# Patient Record
Sex: Female | Born: 1945
Health system: Southern US, Community
[De-identification: ages and names within clinical notes are randomized; demographics above are authoritative.]

## PROBLEM LIST (undated history)

## (undated) DIAGNOSIS — S270XXA Traumatic pneumothorax, initial encounter: Secondary | ICD-10-CM

## (undated) DIAGNOSIS — F329 Major depressive disorder, single episode, unspecified: Secondary | ICD-10-CM

## (undated) DIAGNOSIS — C439 Malignant melanoma of skin, unspecified: Secondary | ICD-10-CM

## (undated) DIAGNOSIS — S42002A Fracture of unspecified part of left clavicle, initial encounter for closed fracture: Secondary | ICD-10-CM

## (undated) DIAGNOSIS — F32A Depression, unspecified: Secondary | ICD-10-CM

## (undated) DIAGNOSIS — S2242XA Multiple fractures of ribs, left side, initial encounter for closed fracture: Secondary | ICD-10-CM

## (undated) DIAGNOSIS — G4730 Sleep apnea, unspecified: Secondary | ICD-10-CM

## (undated) DIAGNOSIS — J189 Pneumonia, unspecified organism: Secondary | ICD-10-CM

## (undated) DIAGNOSIS — M1990 Unspecified osteoarthritis, unspecified site: Secondary | ICD-10-CM

## (undated) DIAGNOSIS — S2220XA Unspecified fracture of sternum, initial encounter for closed fracture: Secondary | ICD-10-CM

## (undated) DIAGNOSIS — R0602 Shortness of breath: Secondary | ICD-10-CM

## (undated) DIAGNOSIS — M1710 Unilateral primary osteoarthritis, unspecified knee: Secondary | ICD-10-CM

## (undated) DIAGNOSIS — I10 Essential (primary) hypertension: Secondary | ICD-10-CM

## (undated) DIAGNOSIS — H269 Unspecified cataract: Secondary | ICD-10-CM

## (undated) DIAGNOSIS — M179 Osteoarthritis of knee, unspecified: Secondary | ICD-10-CM

## (undated) DIAGNOSIS — S37009A Unspecified injury of unspecified kidney, initial encounter: Secondary | ICD-10-CM

## (undated) HISTORY — PX: TONSILLECTOMY AND ADENOIDECTOMY: SUR1326

## (undated) HISTORY — PX: ADENOIDECTOMY: SUR15

## (undated) HISTORY — PX: CHOLECYSTECTOMY: SHX55

---

## 1972-11-23 DIAGNOSIS — S2220XA Unspecified fracture of sternum, initial encounter for closed fracture: Secondary | ICD-10-CM

## 1972-11-23 DIAGNOSIS — S42002A Fracture of unspecified part of left clavicle, initial encounter for closed fracture: Secondary | ICD-10-CM

## 1972-11-23 DIAGNOSIS — S270XXA Traumatic pneumothorax, initial encounter: Secondary | ICD-10-CM

## 1972-11-23 DIAGNOSIS — S2242XA Multiple fractures of ribs, left side, initial encounter for closed fracture: Secondary | ICD-10-CM

## 1972-11-23 HISTORY — DX: Multiple fractures of ribs, left side, initial encounter for closed fracture: S22.42XA

## 1972-11-23 HISTORY — DX: Fracture of unspecified part of left clavicle, initial encounter for closed fracture: S42.002A

## 1972-11-23 HISTORY — DX: Unspecified fracture of sternum, initial encounter for closed fracture: S22.20XA

## 1972-11-23 HISTORY — DX: Traumatic pneumothorax, initial encounter: S27.0XXA

## 1988-11-23 DIAGNOSIS — C439 Malignant melanoma of skin, unspecified: Secondary | ICD-10-CM

## 1988-11-23 HISTORY — DX: Malignant melanoma of skin, unspecified: C43.9

## 2001-03-29 ENCOUNTER — Ambulatory Visit (HOSPITAL_COMMUNITY): Admission: RE | Admit: 2001-03-29 | Discharge: 2001-03-29 | Payer: Self-pay | Admitting: Gastroenterology

## 2001-03-29 ENCOUNTER — Encounter (INDEPENDENT_AMBULATORY_CARE_PROVIDER_SITE_OTHER): Payer: Self-pay

## 2002-09-24 ENCOUNTER — Ambulatory Visit (HOSPITAL_BASED_OUTPATIENT_CLINIC_OR_DEPARTMENT_OTHER): Admission: RE | Admit: 2002-09-24 | Discharge: 2002-09-24 | Payer: Self-pay | Admitting: Family Medicine

## 2002-11-03 ENCOUNTER — Encounter: Payer: Self-pay | Admitting: Emergency Medicine

## 2002-11-03 ENCOUNTER — Emergency Department (HOSPITAL_COMMUNITY): Admission: EM | Admit: 2002-11-03 | Discharge: 2002-11-03 | Payer: Self-pay | Admitting: Emergency Medicine

## 2004-11-23 HISTORY — PX: SHOULDER ARTHROSCOPY: SHX128

## 2005-07-23 ENCOUNTER — Ambulatory Visit (HOSPITAL_COMMUNITY): Admission: RE | Admit: 2005-07-23 | Discharge: 2005-07-24 | Payer: Self-pay | Admitting: Orthopaedic Surgery

## 2010-07-07 ENCOUNTER — Other Ambulatory Visit: Admission: RE | Admit: 2010-07-07 | Discharge: 2010-07-07 | Payer: Self-pay | Admitting: Family Medicine

## 2011-04-10 NOTE — Procedures (Signed)
[facility]  Patient:    Kristina Perkins, Kristina Perkins                       MRN: [id number] Proc. Date: 03/29/01 Adm. Date:  [id number] Disc. Date: [id number] Attending:  Nelda Marseille CC:         Charles A. Tenny Craw, M.D.   Procedure Report  PROCEDURE:  Colonoscopy with biopsy.  INDICATIONS FOR PROCEDURE:  Family history of colon cancer in a patient due for colonic screening. Occasional gas and urgency status post cholecystectomy.  Consent was signed after risks, benefits, methods, and options were thoroughly discussed in the office.  MEDICINES USED:  Demerol 100, Versed 10.  DESCRIPTION OF PROCEDURE:  Rectal inspection was pertinent for external hemorrhoids, small. Digital exam was negative. The video colonoscope was inserted, easily advanced to the ascending colon. We could see the ileocecal valve in the distance. To advance to the cecum did require rolling her on her back and some abdominal pressure. The prep in the cecum and ascending was poor. Lots of washing and suctioning were done. No obvious mass or lesion was seen but some in the stool was stuck to the wall of the colon. The cecum was identified by the appendiceal orifice and the ileocecal valve. The scope was slowly withdrawn. The rest of the prep was adequate. There was minimal liquid stool elsewhere that required washing and suctioning. On slow withdrawal through the colon, two tiny polyps were seen; one in the mid descending and one in the proximal rectum, both cold biopsied x 1 or 2 and put in the same container. There was a rare early left sided diverticula but no other abnormalities were seen. Once back in the rectum, the scope was retroflexed pertinent for some internal hemorrhoids. The scope was straightened, air was withdrawn and the scope removed. The patient tolerated the procedure well. There was no obvious or immediate complication.  ENDOSCOPIC DIAGNOSIS: 1. Internal/external  hemorrhoids. 2. Questionable tiny rectal and descending polyp cold biopsied. 3. Early left sided diverticula. 4. Otherwise within normal limits to the cecum with a poor prep in the cecum    and the proximal ascending.  PLAN:  Await pathology. Would probably recheck screening in five years. Yearly rectals and guaiacs per Dr. Tenny Craw. Happy to see back p.r.n. particularly if her dysphagia gets worse or she would like to talk about medicines for gas and urgency like antispasmodics, Carafate or Questran which also Dr. Tenny Craw could try. DD:  03/29/01 TD:  03/29/01 Job: [id number]

## 2011-04-10 NOTE — Op Note (Signed)
Kristina Perkins, Kristina Perkins                ACCOUNT NO.:  [id number]   MEDICAL RECORD NO.:  [id number]          PATIENT TYPE:  AMB   LOCATION:  SDS                          FACILITY:  [facility]   PHYSICIAN:  Lubertha Basque. Dalldorf, M.D.DATE OF BIRTH:  [birth year]   DATE OF PROCEDURE:  07/23/2005  DATE OF DISCHARGE:                                 OPERATIVE REPORT   PREOPERATIVE DIAGNOSES:  1.  Left shoulder impingement.  2.  Left shoulder AC degeneration.  3.  Left shoulder biceps degeneration.  4.  Bilateral knee degenerative arthritis.   POSTOPERATIVE DIAGNOSES:  1.  Left shoulder impingement.  2.  Left shoulder AC degeneration.  3.  Left shoulder biceps degeneration.  4.  Bilateral knee degenerative arthritis.   PROCEDURES:  1.  Left shoulder arthroscopic acromioplasty.  2.  Left shoulder arthroscopic AC resection.  3.  Left shoulder arthroscopic biceps tenotomy.  4.  Bilateral knee injections.   ANESTHESIA:  General and block.   ATTENDING SURGEON:  Lubertha Basque. Jerl Santos, M.D.   ASSISTANT:  Lindwood Qua, P.A.   INDICATIONS FOR PROCEDURE:  The patient is a 65 year old woman with long  history of left shoulder pain and bilateral end-stage knee degenerative  arthritis. At her shoulder she has failed conservative measures of oral anti-  inflammatories, therapy, and an injection which afforded her about 50%  relief. She has undergone an MRI scan which shows some  biceps degeneration  along with things consistent with impingement and AC degeneration. She has  pain which limits her ability to remain active and rest and she is offered  an arthroscopy. Informed operative consent was obtained after discussion of  possible complications of reaction to anesthesia and infection. She also has  end-stage degeneration of both her knees and wished had these injected while  asleep.   DESCRIPTION OF PROCEDURE:  The patient was taken to the operating suite  where general anesthetic was applied without  difficulty. She is also given a  block in the preanesthesia area. She was positioned in beach-chair position  and prepped and draped in normal sterile fashion. After administration of  preoperative IV antibiotics, an arthroscopy of the left shoulder was  performed through a total of three portals. Glenohumeral joint showed  minimal degenerative change while the biceps tendon was degenerative in the  shoulder. I debrided this and she was left with about two thirds of her  biceps tendon though this did not even appear healthy, so we elected to  perform a tenotomy. She was counseled before the case how she might end up  with a prominence in her upper arm and was fine with that. I shaved this  back to the glenoid. Rotator cuff appeared benign from below except for  small partial-thickness tear debrided. In the subacromial space, she had a  great deal of bursitis and a thorough bursectomy was done. No significant  tear of the cuff could be seen from this aspect. She had a very prominent  subacromial morphology addressed with an acromioplasty back to a flat  surface. This was done with the bur in the lateral  position followed by  transfer of the bur to the posterior position. She also had bone-on-bone  contact at the [facility] joint and a formal AC decompression was done through the  anterior portal. The shoulder was thoroughly irrigated followed by placement  of some Marcaine. Simple sutures of nylon were used to loosely reapproximate  the portals followed by Adaptic and a dry gauze dressing and tape. We then  injected both knees after sterile prep with alcohol using Depo-Medrol and  Marcaine. Band-Aids were applied here. Estimated blood loss and  intraoperative fluids can be obtained from anesthesia records.   DISPOSITION:  The patient was extubated in the operating room and taken to  recovery in stable addition. Plans were for her to go home the same day and  to follow up in the office in less than  a week. I will contact her by phone  tonight.      Lubertha Basque Jerl Santos, M.D.  Electronically Signed     PGD/MEDQ  D:  07/23/2005  T:  07/23/2005  Job:  [id number]

## 2011-10-08 ENCOUNTER — Other Ambulatory Visit: Payer: Self-pay | Admitting: Orthopaedic Surgery

## 2011-10-19 ENCOUNTER — Encounter (HOSPITAL_COMMUNITY): Payer: Self-pay | Admitting: Pharmacy Technician

## 2011-10-22 ENCOUNTER — Other Ambulatory Visit: Payer: Self-pay | Admitting: Obstetrics & Gynecology

## 2011-10-22 DIAGNOSIS — R1900 Intra-abdominal and pelvic swelling, mass and lump, unspecified site: Secondary | ICD-10-CM

## 2011-10-23 ENCOUNTER — Encounter (HOSPITAL_COMMUNITY)
Admission: RE | Admit: 2011-10-23 | Discharge: 2011-10-23 | Disposition: A | Discharge status: Home / Self Care | Payer: 59 | Source: Ambulatory Visit | Attending: Orthopaedic Surgery | Admitting: Orthopaedic Surgery

## 2011-10-23 ENCOUNTER — Encounter (HOSPITAL_COMMUNITY): Payer: Self-pay

## 2011-10-23 ENCOUNTER — Other Ambulatory Visit: Payer: Self-pay | Admitting: Obstetrics & Gynecology

## 2011-10-23 ENCOUNTER — Other Ambulatory Visit: Payer: Self-pay

## 2011-10-23 ENCOUNTER — Ambulatory Visit (HOSPITAL_COMMUNITY)
Admission: RE | Admit: 2011-10-23 | Discharge: 2011-10-23 | Disposition: A | Discharge status: Home / Self Care | Payer: 59 | Source: Ambulatory Visit | Attending: Obstetrics & Gynecology | Admitting: Obstetrics & Gynecology

## 2011-10-23 DIAGNOSIS — R1900 Intra-abdominal and pelvic swelling, mass and lump, unspecified site: Secondary | ICD-10-CM | POA: Insufficient documentation

## 2011-10-23 DIAGNOSIS — M549 Dorsalgia, unspecified: Secondary | ICD-10-CM | POA: Insufficient documentation

## 2011-10-23 DIAGNOSIS — Z780 Asymptomatic menopausal state: Secondary | ICD-10-CM

## 2011-10-23 DIAGNOSIS — Z1231 Encounter for screening mammogram for malignant neoplasm of breast: Secondary | ICD-10-CM

## 2011-10-23 DIAGNOSIS — N949 Unspecified condition associated with female genital organs and menstrual cycle: Secondary | ICD-10-CM | POA: Insufficient documentation

## 2011-10-23 HISTORY — DX: Malignant melanoma of skin, unspecified: C43.9

## 2011-10-23 HISTORY — DX: Unspecified osteoarthritis, unspecified site: M19.90

## 2011-10-23 HISTORY — DX: Essential (primary) hypertension: I10

## 2011-10-23 HISTORY — DX: Sleep apnea, unspecified: G47.30

## 2011-10-23 HISTORY — DX: Traumatic pneumothorax, initial encounter: S27.0XXA

## 2011-10-23 HISTORY — DX: Major depressive disorder, single episode, unspecified: F32.9

## 2011-10-23 HISTORY — DX: Unspecified injury of unspecified kidney, initial encounter: S37.009A

## 2011-10-23 HISTORY — DX: Unspecified cataract: H26.9

## 2011-10-23 HISTORY — DX: Fracture of unspecified part of left clavicle, initial encounter for closed fracture: S42.002A

## 2011-10-23 HISTORY — DX: Unspecified fracture of sternum, initial encounter for closed fracture: S22.20XA

## 2011-10-23 HISTORY — DX: Pneumonia, unspecified organism: J18.9

## 2011-10-23 HISTORY — DX: Multiple fractures of ribs, left side, initial encounter for closed fracture: S22.42XA

## 2011-10-23 HISTORY — DX: Depression, unspecified: F32.A

## 2011-10-23 LAB — PROTIME-INR: Prothrombin Time: 13.6 seconds (ref 11.6–15.2)

## 2011-10-23 LAB — TYPE AND SCREEN: Antibody Screen: NEGATIVE

## 2011-10-23 LAB — DIFFERENTIAL
Basophils Absolute: 0 10*3/uL (ref 0.0–0.1)
Eosinophils Relative: 3 % (ref 0–5)
Lymphocytes Relative: 42 % (ref 12–46)
Neutro Abs: 2.8 10*3/uL (ref 1.7–7.7)

## 2011-10-23 LAB — CBC
MCV: 87.6 fL (ref 78.0–100.0)
Platelets: 227 10*3/uL (ref 150–400)
RDW: 14.1 % (ref 11.5–15.5)
WBC: 5.7 10*3/uL (ref 4.0–10.5)

## 2011-10-23 LAB — BASIC METABOLIC PANEL
Calcium: 9.8 mg/dL (ref 8.4–10.5)
Creatinine, Ser: 0.65 mg/dL (ref 0.50–1.10)
GFR calc Af Amer: >90 mL/min (ref >90)

## 2011-10-23 LAB — URINE MICROSCOPIC-ADD ON

## 2011-10-23 LAB — URINALYSIS, ROUTINE W REFLEX MICROSCOPIC
Bilirubin Urine: NEGATIVE
Glucose, UA: NEGATIVE mg/dL
Hgb urine dipstick: NEGATIVE
Specific Gravity, Urine: 1.004 — ABNORMAL LOW (ref 1.005–1.030)
Urobilinogen, UA: 0.2 mg/dL (ref 0.0–1.0)

## 2011-10-23 LAB — SURGICAL PCR SCREEN: Staphylococcus aureus: NEGATIVE

## 2011-10-23 LAB — APTT: aPTT: 31 seconds (ref 24–37)

## 2011-10-23 NOTE — Pre-Procedure Instructions (Signed)
20 Kristina Perkins  10/23/2011   Your procedure is scheduled on:  November 03, 2011  Report to [facility] at 0800 AM.  Call this number if you have problems the morning of surgery: [phone]   Remember:   Do not eat food:After Midnight.  May have clear liquids: up to 4 Hours before arrival.  Clear liquids include soda, tea, black coffee, apple or grape juice, broth.  Take these medicines the morning of surgery with A SIP OF WATER: Xanax   Do not wear jewelry, make-up or nail polish.  Do not wear lotions, powders, or perfumes. You may wear deodorant.  Do not shave 48 hours prior to surgery.  Do not bring valuables to the hospital.  Contacts, dentures or bridgework may not be worn into surgery.  Leave suitcase in the car. After surgery it may be brought to your room.  For patients admitted to the hospital, checkout time is 11:00 AM the day of discharge.   Patients discharged the day of surgery will not be allowed to drive home.  Special Instructions: Incentive Spirometry - Practice and bring it with you on the day of surgery. and CHG Shower Use Special Wash: 1/2 bottle night before surgery and 1/2 bottle morning of surgery.   Please read over the following fact sheets that you were given: Pain Booklet, Coughing and Deep Breathing, Blood Transfusion Information and Surgical Site Infection Prevention

## 2011-10-23 NOTE — Progress Notes (Signed)
Requested sleep study from [facility]. Will send chart to anesthesia for review of airway history

## 2011-10-26 NOTE — Anesthesia Preprocedure Evaluation (Addendum)
Anesthesia Evaluation  Patient identified by MRN, date of birth, ID band Patient awake  General Assessment Comment:Glidescope utilized in 2006  Reviewed: Allergy & Precautions, H&P , NPO status , Patient's Chart, lab work & pertinent test results  History of Anesthesia Complications (+) DIFFICULT AIRWAY  Airway Mallampati: I TM Distance: >3 FB Neck ROM: Full    Dental  (+) Teeth Intact and Dental Advisory Given   Pulmonary sleep apnea and Continuous Positive Airway Pressure Ventilation , pneumonia ,    Pulmonary exam normal       Cardiovascular Exercise Tolerance: Good hypertension, Pt. on medications     Neuro/Psych    GI/Hepatic   Endo/Other  Morbid obesity  Renal/GU      Musculoskeletal   Abdominal   Peds  Hematology   Anesthesia Other Findings   Reproductive/Obstetrics                         Anesthesia Physical Anesthesia Plan  ASA: III  Anesthesia Plan: General   Post-op Pain Management:    Induction: Intravenous  Airway Management Planned: LMA  Additional Equipment:   Intra-op Plan:   Post-operative Plan: Extubation in OR  Informed Consent: I have reviewed the patients History and Physical, chart, labs and discussed the procedure including the risks, benefits and alternatives for the proposed anesthesia with the patient or authorized representative who has indicated his/her understanding and acceptance.   Dental advisory given  Plan Discussed with: CRNA, Anesthesiologist and Surgeon  Anesthesia Plan Comments: (History of difficult intubation.  Glidescope utilized in 2006.)       Anesthesia Quick Evaluation
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
## 2011-10-27 ENCOUNTER — Other Ambulatory Visit: Payer: Self-pay | Admitting: Obstetrics & Gynecology

## 2011-10-27 DIAGNOSIS — R1900 Intra-abdominal and pelvic swelling, mass and lump, unspecified site: Secondary | ICD-10-CM

## 2011-10-30 ENCOUNTER — Ambulatory Visit (HOSPITAL_COMMUNITY)
Admission: RE | Admit: 2011-10-30 | Discharge: 2011-10-30 | Disposition: A | Discharge status: Home / Self Care | Payer: 59 | Source: Ambulatory Visit | Attending: Obstetrics & Gynecology | Admitting: Obstetrics & Gynecology

## 2011-10-30 DIAGNOSIS — D252 Subserosal leiomyoma of uterus: Secondary | ICD-10-CM | POA: Insufficient documentation

## 2011-10-30 DIAGNOSIS — N949 Unspecified condition associated with female genital organs and menstrual cycle: Secondary | ICD-10-CM | POA: Insufficient documentation

## 2011-10-30 DIAGNOSIS — Z85820 Personal history of malignant melanoma of skin: Secondary | ICD-10-CM | POA: Insufficient documentation

## 2011-10-30 DIAGNOSIS — R1900 Intra-abdominal and pelvic swelling, mass and lump, unspecified site: Secondary | ICD-10-CM

## 2011-10-30 MED ORDER — GADOBENATE DIMEGLUMINE 529 MG/ML IV SOLN
20.0000 mL | Freq: Once | INTRAVENOUS | Status: AC | PRN
  Administered 2011-10-30: 20 mL via INTRAVENOUS

## 2011-10-31 NOTE — H&P (Signed)
Kristina Perkins is an 65 y.o. female.   Chief Complaint: Left knee pain HPI: pt c/o increasing left knee pain. Now pain with every step and night pain. She has failed nsaid and injection. Xrays show bone on bone djd left knee. We have discussed tkr in detail with the patient  Past Medical History  Diagnosis Date  . Difficult intubation     had trouble with previous surgery while here  . Hypertension   . Degenerative joint disease   . Asthma     hx of  . Sleep apnea     uses CPAP sleep study done at [facility]  . Melanoma     22 years ago  . Cataracts, bilateral   . Pneumonia     no recent history of  . Depression   . Pneumothorax, traumatic     38 years ago  . Traumatic injury of the kidney     38 years ago  . Multiple closed fractures of ribs of left side     38 years ago  . Fracture, sternum closed     38 years ago  . Fracture of left clavicle     38 years ago    Past Surgical History  Procedure Date  . Tonsillectomy   . Adenoidectomy   . Cesarean section   . Shoulder arthroscopy     left  . Cholecystectomy     No family history on file. Social History:  reports that she has never smoked. She does not have any smokeless tobacco history on file. She reports that she drinks alcohol. She reports that she does not use illicit drugs.  Allergies:  Allergies  Allergen Reactions  . Sulfa Antibiotics Other (See Comments)    Blood in urine    No current facility-administered medications on file as of .   No current outpatient prescriptions on file as of .    No results found for this or any previous visit (from the past 48 hour(s)). Mr Pelvis W Wo Contrast  10/30/2011  *RADIOLOGY REPORT*  Clinical Data: Pelvic pain.  Pelvic mass recently discovered on ultrasound.  Otherwise asymptomatic.  Prior history of melanoma.  MRI PELVIS WITHOUT AND WITH CONTRAST  Technique:  Multiplanar multisequence MR imaging of the pelvis was performed both before and after administration of  intravenous contrast.  Contrast: 20mL MULTIHANCE GADOBENATE DIMEGLUMINE 529 MG/ML IV SOLN  Comparison: Ultrasound 10/23/2011  Findings: There is a focal fibroid identified emanating off of the left anterolateral lower uterine segment with a significant subserosal component measuring 5.4 x 4.7 x 4.8 cm.  This demonstrates predominately diffuse low signal on T2-weighted images with some central areas of signal loss which would correlate with calcifications seen on prior recent ultrasound.  Several smaller mural fibroids are identified in the anterior upper uterine segment portion of the myometrium.  Overall uterine size is 9 cm in length, 3.5 cm in depth and 6.5 cm in width including the fibroid.  A normal postmenopausal appearance to the endometrium, cervix and vagina are noted.  Both ovaries have a normal postmenopausal appearance and are best imaged on image #8 of series 4.  The bladder and urethra have a normal appearance. The large fibroid does cause some extrinsic compression on the posterior aspect of the bladder.  A trace of simple free fluid is noted in the pelvis. No pathologic adenopathy is seen. No worrisome bone lesions are seen.  IMPRESSION: The previously identified calcified left adnexal mass corresponds to a left  subserosal degenerated fibroid.  Several smaller mural fibroids are also evident.  Otherwise unremarkable postmenopausal pelvic MRI.  Original Report Authenticated By: Bertha Stakes, M.D.    Review of Systems  Constitutional: Negative.   HENT:       Glasses  Eyes: Negative.   Respiratory: Negative.   Cardiovascular: Negative.   Gastrointestinal: Negative.   Genitourinary: Negative.   Musculoskeletal: Negative.   Skin: Negative.   Neurological: Negative.   Endo/Heme/Allergies: Negative.   Psychiatric/Behavioral: The patient is nervous/anxious.     There were no vitals taken for this visit. Physical Exam  Constitutional: She is oriented to person, place, and time. She  appears well-developed.  HENT:  Head: Normocephalic.  Eyes: Pupils are equal, round, and reactive to light.  Neck: Normal range of motion.  Cardiovascular: Normal rate.   Respiratory: Effort normal.  GI: Soft.  Musculoskeletal:       Left knee -rom- 0-110. Some fluid and crepitation. Pain with rom.  Neurological: She is oriented to person, place, and time. She has normal reflexes.  Skin: Skin is warm.  Psychiatric: She has a normal mood and affect.     Assessment Left knee djd. Plan: Left TKR to improve pt's pain   Venie Montesinos R 10/31/2011, 10:44 AM

## 2011-11-03 ENCOUNTER — Encounter (HOSPITAL_COMMUNITY)
Admission: RE | Disposition: A | Discharge status: Home Health | Payer: Self-pay | Source: Ambulatory Visit | Attending: Orthopaedic Surgery

## 2011-11-03 ENCOUNTER — Encounter (HOSPITAL_COMMUNITY): Payer: Self-pay | Admitting: Surgery

## 2011-11-03 ENCOUNTER — Encounter (HOSPITAL_COMMUNITY): Payer: Self-pay | Admitting: *Deleted

## 2011-11-03 ENCOUNTER — Encounter (HOSPITAL_COMMUNITY): Payer: Self-pay | Admitting: Vascular Surgery

## 2011-11-03 ENCOUNTER — Inpatient Hospital Stay (HOSPITAL_COMMUNITY)
Admission: RE | Admit: 2011-11-03 | Discharge: 2011-11-06 | DRG: 470 | Disposition: A | Discharge status: Home Health | Payer: 59 | Source: Ambulatory Visit | Attending: Orthopaedic Surgery | Admitting: Orthopaedic Surgery

## 2011-11-03 ENCOUNTER — Inpatient Hospital Stay (HOSPITAL_COMMUNITY): Payer: 59 | Admitting: Vascular Surgery

## 2011-11-03 DIAGNOSIS — M1712 Unilateral primary osteoarthritis, left knee: Secondary | ICD-10-CM | POA: Diagnosis present

## 2011-11-03 DIAGNOSIS — M1710 Unilateral primary osteoarthritis, unspecified knee: Principal | ICD-10-CM | POA: Diagnosis present

## 2011-11-03 DIAGNOSIS — G4730 Sleep apnea, unspecified: Secondary | ICD-10-CM | POA: Diagnosis present

## 2011-11-03 DIAGNOSIS — F329 Major depressive disorder, single episode, unspecified: Secondary | ICD-10-CM | POA: Diagnosis present

## 2011-11-03 DIAGNOSIS — F3289 Other specified depressive episodes: Secondary | ICD-10-CM | POA: Diagnosis present

## 2011-11-03 DIAGNOSIS — Z85820 Personal history of malignant melanoma of skin: Secondary | ICD-10-CM

## 2011-11-03 DIAGNOSIS — H269 Unspecified cataract: Secondary | ICD-10-CM | POA: Diagnosis present

## 2011-11-03 DIAGNOSIS — I10 Essential (primary) hypertension: Secondary | ICD-10-CM | POA: Diagnosis present

## 2011-11-03 HISTORY — DX: Osteoarthritis of knee, unspecified: M17.9

## 2011-11-03 HISTORY — DX: Unilateral primary osteoarthritis, unspecified knee: M17.10

## 2011-11-03 HISTORY — PX: TOTAL KNEE ARTHROPLASTY: SHX125

## 2011-11-03 HISTORY — DX: Shortness of breath: R06.02

## 2011-11-03 HISTORY — DX: Unspecified osteoarthritis, unspecified site: M19.90

## 2011-11-03 SURGERY — ARTHROPLASTY, KNEE, TOTAL
Anesthesia: Regional | Site: Knee | Laterality: Left | Wound class: Clean

## 2011-11-03 MED ORDER — CEFUROXIME SODIUM 1.5 G IJ SOLR
INTRAMUSCULAR | Status: DC | PRN
  Administered 2011-11-03: 1.5 g

## 2011-11-03 MED ORDER — ONDANSETRON HCL 4 MG/2ML IJ SOLN
INTRAMUSCULAR | Status: DC | PRN
  Administered 2011-11-03: 4 mg via INTRAVENOUS

## 2011-11-03 MED ORDER — PATIENT'S GUIDE TO USING COUMADIN BOOK
Freq: Once | Status: DC
  Filled 2011-11-03: qty 1

## 2011-11-03 MED ORDER — HYDROCHLOROTHIAZIDE 12.5 MG PO CAPS
25.0000 mg | ORAL_CAPSULE | Freq: Every day | ORAL | Status: DC
  Administered 2011-11-03 – 2011-11-06 (×3): 25 mg via ORAL
  Filled 2011-11-03 (×4): qty 2

## 2011-11-03 MED ORDER — MIDAZOLAM HCL 2 MG/2ML IJ SOLN
INTRAMUSCULAR | Status: AC
  Filled 2011-11-03: qty 2

## 2011-11-03 MED ORDER — HYDROMORPHONE HCL PF 1 MG/ML IJ SOLN
0.5000 mg | INTRAMUSCULAR | Status: DC | PRN
  Administered 2011-11-03 – 2011-11-04 (×2): 1 mg via INTRAVENOUS
  Filled 2011-11-03 (×2): qty 1

## 2011-11-03 MED ORDER — ALPRAZOLAM 0.25 MG PO TABS: 0.2500 mg | ORAL_TABLET | Freq: Three times a day (TID) | ORAL | Status: DC | PRN

## 2011-11-03 MED ORDER — SODIUM CHLORIDE 0.9 % IR SOLN
Status: DC | PRN
  Administered 2011-11-03: 3000 mL

## 2011-11-03 MED ORDER — ENOXAPARIN SODIUM 30 MG/0.3ML ~~LOC~~ SOLN
30.0000 mg | Freq: Two times a day (BID) | SUBCUTANEOUS | Status: DC
  Administered 2011-11-04 – 2011-11-06 (×5): 30 mg via SUBCUTANEOUS
  Filled 2011-11-03 (×6): qty 0.3

## 2011-11-03 MED ORDER — OXYCODONE-ACETAMINOPHEN 5-325 MG PO TABS
1.0000 | ORAL_TABLET | ORAL | Status: DC | PRN
  Administered 2011-11-03: 1 via ORAL
  Administered 2011-11-04 (×2): 2 via ORAL
  Administered 2011-11-04 (×2): 1 via ORAL
  Administered 2011-11-05 (×4): 2 via ORAL
  Administered 2011-11-06: 1 via ORAL
  Administered 2011-11-06: 2 via ORAL
  Filled 2011-11-03 (×3): qty 2
  Filled 2011-11-03: qty 1
  Filled 2011-11-03 (×4): qty 2
  Filled 2011-11-03: qty 1
  Filled 2011-11-03 (×2): qty 2

## 2011-11-03 MED ORDER — LACTATED RINGERS IV SOLN
INTRAVENOUS | Status: DC
  Administered 2011-11-03: 09:00:00 via INTRAVENOUS

## 2011-11-03 MED ORDER — ACETAMINOPHEN 650 MG RE SUPP: 650.0000 mg | Freq: Four times a day (QID) | RECTAL | Status: DC | PRN

## 2011-11-03 MED ORDER — ONDANSETRON HCL 4 MG PO TABS: 4.0000 mg | ORAL_TABLET | Freq: Four times a day (QID) | ORAL | Status: DC | PRN

## 2011-11-03 MED ORDER — FENTANYL CITRATE 0.05 MG/ML IJ SOLN
INTRAMUSCULAR | Status: DC | PRN
  Administered 2011-11-03 (×2): 100 ug via INTRAVENOUS

## 2011-11-03 MED ORDER — METOCLOPRAMIDE HCL 5 MG/ML IJ SOLN
5.0000 mg | Freq: Three times a day (TID) | INTRAMUSCULAR | Status: DC | PRN
  Filled 2011-11-03: qty 2

## 2011-11-03 MED ORDER — SUCCINYLCHOLINE CHLORIDE 20 MG/ML IJ SOLN
INTRAMUSCULAR | Status: DC | PRN
  Administered 2011-11-03 (×2): 100 mg via INTRAVENOUS

## 2011-11-03 MED ORDER — SENNOSIDES-DOCUSATE SODIUM 8.6-50 MG PO TABS: 1.0000 | ORAL_TABLET | Freq: Every evening | ORAL | Status: DC | PRN

## 2011-11-03 MED ORDER — CEFAZOLIN SODIUM 1-5 GM-% IV SOLN
INTRAVENOUS | Status: AC
  Filled 2011-11-03: qty 100

## 2011-11-03 MED ORDER — METOCLOPRAMIDE HCL 5 MG PO TABS
5.0000 mg | ORAL_TABLET | Freq: Three times a day (TID) | ORAL | Status: DC | PRN
  Filled 2011-11-03: qty 2

## 2011-11-03 MED ORDER — PHENOL 1.4 % MT LIQD
1.0000 | OROMUCOSAL | Status: DC | PRN
  Filled 2011-11-03: qty 177

## 2011-11-03 MED ORDER — MEPERIDINE HCL 25 MG/ML IJ SOLN: 6.2500 mg | INTRAMUSCULAR | Status: DC | PRN

## 2011-11-03 MED ORDER — LACTATED RINGERS IV SOLN
INTRAVENOUS | Status: DC | PRN
  Administered 2011-11-03 (×3): via INTRAVENOUS

## 2011-11-03 MED ORDER — METHOCARBAMOL 500 MG PO TABS
500.0000 mg | ORAL_TABLET | Freq: Four times a day (QID) | ORAL | Status: DC | PRN
  Administered 2011-11-04 – 2011-11-06 (×5): 500 mg via ORAL
  Filled 2011-11-03 (×7): qty 1

## 2011-11-03 MED ORDER — DOCUSATE SODIUM 100 MG PO CAPS
100.0000 mg | ORAL_CAPSULE | Freq: Two times a day (BID) | ORAL | Status: DC
  Administered 2011-11-03 – 2011-11-06 (×6): 100 mg via ORAL
  Filled 2011-11-03 (×7): qty 1

## 2011-11-03 MED ORDER — MIDAZOLAM HCL 2 MG/2ML IJ SOLN
1.0000 mg | INTRAMUSCULAR | Status: DC | PRN
  Administered 2011-11-03: 2 mg via INTRAVENOUS

## 2011-11-03 MED ORDER — HYDROMORPHONE HCL PF 1 MG/ML IJ SOLN
0.2500 mg | INTRAMUSCULAR | Status: DC | PRN
  Administered 2011-11-03: 0.5 mg via INTRAVENOUS

## 2011-11-03 MED ORDER — WARFARIN VIDEO: Freq: Once | Status: DC

## 2011-11-03 MED ORDER — PROPOFOL 10 MG/ML IV EMUL
INTRAVENOUS | Status: DC | PRN
  Administered 2011-11-03: 100 mg via INTRAVENOUS
  Administered 2011-11-03: 200 mg via INTRAVENOUS
  Administered 2011-11-03 (×3): 100 mg via INTRAVENOUS

## 2011-11-03 MED ORDER — FENTANYL CITRATE 0.05 MG/ML IJ SOLN
50.0000 ug | INTRAMUSCULAR | Status: DC | PRN
  Administered 2011-11-03: 100 ug via INTRAVENOUS

## 2011-11-03 MED ORDER — MORPHINE SULFATE 2 MG/ML IJ SOLN: 0.0500 mg/kg | INTRAMUSCULAR | Status: DC | PRN

## 2011-11-03 MED ORDER — MENTHOL 3 MG MT LOZG: 1.0000 | LOZENGE | OROMUCOSAL | Status: DC | PRN

## 2011-11-03 MED ORDER — ACETAMINOPHEN 325 MG PO TABS
650.0000 mg | ORAL_TABLET | Freq: Four times a day (QID) | ORAL | Status: DC | PRN
  Administered 2011-11-04: 650 mg via ORAL
  Filled 2011-11-03: qty 2

## 2011-11-03 MED ORDER — CHLORHEXIDINE GLUCONATE 4 % EX LIQD: 60.0000 mL | Freq: Once | CUTANEOUS | Status: DC

## 2011-11-03 MED ORDER — DIPHENHYDRAMINE HCL 12.5 MG/5ML PO ELIX
12.5000 mg | ORAL_SOLUTION | ORAL | Status: DC | PRN
  Filled 2011-11-03: qty 10

## 2011-11-03 MED ORDER — ONDANSETRON HCL 4 MG/2ML IJ SOLN: 4.0000 mg | Freq: Four times a day (QID) | INTRAMUSCULAR | Status: DC | PRN

## 2011-11-03 MED ORDER — CEFAZOLIN SODIUM 1-5 GM-% IV SOLN
INTRAVENOUS | Status: DC | PRN
  Administered 2011-11-03: 2 g via INTRAVENOUS

## 2011-11-03 MED ORDER — METHOCARBAMOL 100 MG/ML IJ SOLN
500.0000 mg | Freq: Four times a day (QID) | INTRAVENOUS | Status: DC | PRN
  Administered 2011-11-03: 500 mg via INTRAVENOUS
  Filled 2011-11-03: qty 5

## 2011-11-03 MED ORDER — WARFARIN SODIUM 5 MG PO TABS
5.0000 mg | ORAL_TABLET | Freq: Once | ORAL | Status: AC
  Administered 2011-11-03: 5 mg via ORAL
  Filled 2011-11-03: qty 1

## 2011-11-03 MED ORDER — CEFAZOLIN SODIUM-DEXTROSE 2-3 GM-% IV SOLR
2.0000 g | Freq: Three times a day (TID) | INTRAVENOUS | Status: DC
  Administered 2011-11-03 – 2011-11-04 (×2): 2 g via INTRAVENOUS
  Filled 2011-11-03 (×3): qty 50

## 2011-11-03 MED ORDER — LACTATED RINGERS IV SOLN
INTRAVENOUS | Status: DC
  Administered 2011-11-04: 04:00:00 via INTRAVENOUS

## 2011-11-03 MED ORDER — BUPIVACAINE-EPINEPHRINE PF 0.5-1:200000 % IJ SOLN
INTRAMUSCULAR | Status: DC | PRN
  Administered 2011-11-03: 30 mL

## 2011-11-03 MED ORDER — FENTANYL CITRATE 0.05 MG/ML IJ SOLN
INTRAMUSCULAR | Status: AC
  Filled 2011-11-03: qty 2

## 2011-11-03 MED ORDER — ONDANSETRON HCL 4 MG/2ML IJ SOLN: 4.0000 mg | Freq: Once | INTRAMUSCULAR | Status: DC | PRN

## 2011-11-03 SURGICAL SUPPLY — 64 items
AUTOTRANSFUSION W/QD PVC DRAIN (AUTOTRANSFUSION) ×2 IMPLANT
BANDAGE ELASTIC 4 VELCRO ST LF (GAUZE/BANDAGES/DRESSINGS) ×2 IMPLANT
BANDAGE ELASTIC 6 VELCRO ST LF (GAUZE/BANDAGES/DRESSINGS) ×1 IMPLANT
BANDAGE ESMARK 6X9 LF (GAUZE/BANDAGES/DRESSINGS) ×1 IMPLANT
BANDAGE GAUZE ELAST BULKY 4 IN (GAUZE/BANDAGES/DRESSINGS) ×3 IMPLANT
BLADE SAGITTAL 25.0X1.19X90 (BLADE) ×2 IMPLANT
BLADE SURG ROTATE 9660 (MISCELLANEOUS) IMPLANT
BNDG CMPR 9X6 STRL LF SNTH (GAUZE/BANDAGES/DRESSINGS) ×1
BNDG CMPR MED 10X6 ELC LF (GAUZE/BANDAGES/DRESSINGS) ×1
BNDG ELASTIC 6X10 VLCR STRL LF (GAUZE/BANDAGES/DRESSINGS) ×2 IMPLANT
BNDG ESMARK 6X9 LF (GAUZE/BANDAGES/DRESSINGS) ×2
BOWL SMART MIX CTS (DISPOSABLE) ×2 IMPLANT
CEMENT HV SMART SET (Cement) ×3 IMPLANT
CLOTH BEACON ORANGE TIMEOUT ST (SAFETY) ×2 IMPLANT
COVER SURGICAL LIGHT HANDLE (MISCELLANEOUS) ×2 IMPLANT
CUFF TOURNIQUET SINGLE 34IN LL (TOURNIQUET CUFF) ×2 IMPLANT
CUFF TOURNIQUET SINGLE 44IN (TOURNIQUET CUFF) IMPLANT
DRAPE EXTREMITY T 121X128X90 (DRAPE) ×2 IMPLANT
DRAPE PROXIMA HALF (DRAPES) ×2 IMPLANT
DRAPE U-SHAPE 47X51 STRL (DRAPES) ×2 IMPLANT
DRSG ADAPTIC 3X8 NADH LF (GAUZE/BANDAGES/DRESSINGS) ×2 IMPLANT
DRSG PAD ABDOMINAL 8X10 ST (GAUZE/BANDAGES/DRESSINGS) ×2 IMPLANT
DURAPREP 26ML APPLICATOR (WOUND CARE) ×2 IMPLANT
ELECT REM PT RETURN 9FT ADLT (ELECTROSURGICAL) ×2
ELECTRODE REM PT RTRN 9FT ADLT (ELECTROSURGICAL) ×1 IMPLANT
EVACUATOR 1/8 PVC DRAIN (DRAIN) ×2 IMPLANT
FACESHIELD LNG OPTICON STERILE (SAFETY) ×2 IMPLANT
GAUZE SPONGE 4X4 12PLY STRL LF (GAUZE/BANDAGES/DRESSINGS) ×1 IMPLANT
GLOVE BIO SURGEON STRL SZ8.5 (GLOVE) ×2 IMPLANT
GLOVE BIOGEL PI IND STRL 8 (GLOVE) ×1 IMPLANT
GLOVE BIOGEL PI IND STRL 8.5 (GLOVE) ×1 IMPLANT
GLOVE BIOGEL PI INDICATOR 8 (GLOVE) ×1
GLOVE BIOGEL PI INDICATOR 8.5 (GLOVE) ×1
GLOVE SS BIOGEL STRL SZ 8 (GLOVE) ×1 IMPLANT
GLOVE SUPERSENSE BIOGEL SZ 8 (GLOVE) ×1
GOWN PREVENTION PLUS XLARGE (GOWN DISPOSABLE) ×2 IMPLANT
GOWN STRL NON-REIN LRG LVL3 (GOWN DISPOSABLE) ×2 IMPLANT
GOWN STRL REIN 2XL LVL4 (GOWN DISPOSABLE) ×2 IMPLANT
HANDPIECE INTERPULSE COAX TIP (DISPOSABLE) ×2
HOOD PEEL AWAY FACE SHEILD DIS (HOOD) ×2 IMPLANT
IMMOBILIZER KNEE 20 (SOFTGOODS)
IMMOBILIZER KNEE 20 THIGH 36 (SOFTGOODS) IMPLANT
IMMOBILIZER KNEE 22 UNIV (SOFTGOODS) ×2 IMPLANT
IMMOBILIZER KNEE 24 THIGH 36 (MISCELLANEOUS) IMPLANT
IMMOBILIZER KNEE 24 UNIV (MISCELLANEOUS)
KIT BASIN OR (CUSTOM PROCEDURE TRAY) ×2 IMPLANT
KIT ROOM TURNOVER OR (KITS) ×2 IMPLANT
MANIFOLD NEPTUNE II (INSTRUMENTS) ×2 IMPLANT
NS IRRIG 1000ML POUR BTL (IV SOLUTION) ×2 IMPLANT
PACK TOTAL JOINT (CUSTOM PROCEDURE TRAY) ×2 IMPLANT
PAD ARMBOARD 7.5X6 YLW CONV (MISCELLANEOUS) ×4 IMPLANT
SET HNDPC FAN SPRY TIP SCT (DISPOSABLE) ×1 IMPLANT
SPONGE GAUZE 4X4 12PLY (GAUZE/BANDAGES/DRESSINGS) ×2 IMPLANT
STAPLER VISISTAT 35W (STAPLE) ×2 IMPLANT
SUCTION FRAZIER TIP 10 FR DISP (SUCTIONS) ×2 IMPLANT
SUT VIC AB 0 CT1 27 (SUTURE) ×4
SUT VIC AB 0 CT1 27XBRD ANBCTR (SUTURE) ×2 IMPLANT
SUT VIC AB 1 CTB1 27 (SUTURE) ×4 IMPLANT
SUT VIC AB 2-0 CT1 27 (SUTURE) ×4
SUT VIC AB 2-0 CT1 TAPERPNT 27 (SUTURE) ×2 IMPLANT
TOWEL OR 17X24 6PK STRL BLUE (TOWEL DISPOSABLE) ×2 IMPLANT
TOWEL OR 17X26 10 PK STRL BLUE (TOWEL DISPOSABLE) ×2 IMPLANT
TRAY FOLEY CATH 14FR (SET/KITS/TRAYS/PACK) ×2 IMPLANT
WATER STERILE IRR 1000ML POUR (IV SOLUTION) ×6 IMPLANT

## 2011-11-03 NOTE — Op Note (Signed)
PREOP DIAGNOSIS: DJD LEFT KNEE POSTOP DIAGNOSIS: DJD LEFT KNEE PROCEDURE: LEFT TKR ANESTHESIA: General and block ATTENDING SURGEON: Donat Humble G ASSISTANT: Lindwood Qua PA  INDICATIONS FOR PROCEDURE: Kristina Perkins is a 65 y.o. female who has struggled for a long time with pain due to degenerative arthritis of the left knee.  The patient has failed many conservative non-operative measures and at this point has pain which limits the ability to sleep and walk.  The patient is offered total knee replacement.  Informed operative consent was obtained after discussion of possible risks of anesthesia, infection, neurovascular injury, DVT, and death.  The importance of the post-operative rehabilitation protocol to optimize result was stressed extensively with the patient.  SUMMARY OF FINDINGS AND PROCEDURE:  Kristina Perkins was taken to the operative suite where under the above anesthesia a left knee replacement was performed.  There were advanced degenerative changes and the bone quality was fair.  We used the DePuy system and placed size standard plus femur, 4 MBT tibia to address her stature, 38mm all polyethylene patella, and a size 10 spacer.  I did include Zinacef antibiotic in the cement.  The patient was admitted for appropriate post-op care to include perioperative antibiotics and mechanical and pharmacologic measures for DVT prophylaxis.  DESCRIPTION OF PROCEDURE:  Kristina Perkins was taken to the operative suite where the above anesthesia was applied.  The patient was positioned supine and prepped and draped in normal sterile fashion.  An appropriate time out was performed.  After the administration of Kefzol pre-op antibiotic a standard longitudinal incision was made on the anterior knee.  Dissection was carried down to the extensor mechanism.  All appropriate anti-infective measures were used including the pre-operative antibiotic, betadine impregnated drape, and closed hooded exhaust systems  for each member of the surgical team.  A medial parapatellar incision was made in the extensor mechanism and the knee cap flipped and the knee flexed.  Some residual meniscal tissues were removed along with any remaining ACL/PCL tissue.  A guide was placed on the tibia and a flat cut was made on it's superior surface.  An intramedullary guide was placed in the femur and was utilized to make anterior and posterior cuts creating an appropriate flexion gap.  A second intramedullary guide was placed in the femur to make a distal cut properly balancing the knee with an extension gap equal to the flexion gap.  The three bones sized to the above mentioned sizes and the appropriate guides were placed and utilized.  A trial reduction was done and the knee easily came to full extension and the patella tracked well on flexion.  The trial components were removed and all bones were cleaned with pulsatile lavage and then dried thoroughly.  Cement was mixed including antibiotic and was pressurized onto the bones followed by placement of the aforementioned components.  Excess cement was trimmed and pressure was held on the components until the cement had hardened.  The tourniquet was deflated and a small amount of bleeding was controlled with cautery and pressure.  The knee was irrigated and a drain placed exiting superolaterally.  The extensor mechanism was re-approximated with #1 suture.  The knee was flexed and the repair was solid.  The subcutaneous tissues were re-approximated with #0 and #2-0 vicryl and the skin closed with staples.  A sterile dressing was applied.  Intraoperative fluids, EBL, and tourniquet time can be obtained from anesthesia records.  DISPOSITION:  The patient was taken to recovery  room in stable condition and admitted for appropriate post-op care to include peri-operative antibiotic and DVT prophylaxis with mechanical and pharmacologic measures.  Shaleena Crusoe G 11/03/2011, 12:48 PM

## 2011-11-03 NOTE — Anesthesia Procedure Notes (Addendum)
Anesthesia Regional Block:  Femoral nerve block  Pre-Anesthetic Checklist: ,, timeout performed, Correct Patient, Correct Site, Correct Laterality, Correct Procedure, Correct Position, site marked, Risks and benefits discussed,  Surgical consent,  Pre-op evaluation,  At surgeon's request and post-op pain management  Laterality: Left and Lower  Prep: chloraprep       Needles:  Injection technique: Single-shot  Needle Type: Echogenic Needle     Needle Length: 9cm  Needle Gauge: 22 and 22 G    Additional Needles:  Procedures: ultrasound guided Femoral nerve block Narrative:  Start time: 11/03/2011 9:45 AM End time: 11/03/2011 10:00 AM Injection made incrementally with aspirations every 5 mL.  Performed by: Personally  Anesthesiologist: Sheldon Silvan, MD  Additional Notes: Marcaine 0.5% with EPI 1:200000  30 ml  Femoral nerve block Procedure Name: Intubation Date/Time: 11/03/2011 11:20 AM Performed by: Rossie Muskrat Pre-anesthesia Checklist: Timeout performed, Patient identified, Emergency Drugs available, Suction available and Patient being monitored Patient Re-evaluated:Patient Re-evaluated prior to inductionOxygen Delivery Method: Circle System Utilized Preoxygenation: Pre-oxygenation with 100% oxygen Intubation Type: IV induction Ventilation: Mask ventilation without difficulty and Oral airway inserted - appropriate to patient size Grade View: Grade IV Tube type: Oral Number of attempts: 5 or more Airway Equipment and Method: video-laryngoscopy and bougie stylet Placement Confirmation: breath sounds checked- equal and bilateral and positive ETCO2 Secured at: 23 cm Tube secured with: Tape Dental Injury: Bloody posterior oropharynx  Difficulty Due To: Difficulty was anticipated, Difficult Airway- due to large tongue, Difficult Airway- due to reduced neck mobility, Difficult Airway- due to limited oral opening and Difficult Airway- due to anterior larynx Future  Recommendations: Recommend- awake intubation Comments: Multiple DL attempts for view of cords with Glidescope required. Immediate use of glidescope was initiated due to known difficult airway. Syliva Overman, CRNA & Kingsley Callander, CRNA, made attempts to obtain view of cords unsuccessfully with Glidescope. Pt was easily hand ventilated with oral airway in place between each glidescope attempt. Sats remained 97-100% through each glidescope attempt. Unable to visualize epiglottis. Head repositioned; 2nd glidescope attempted by T. Wilhelmenia Blase. Epiglottis in view only (no view beyond epiglottis). Blind intubation attempted unsuccessfully, No ETCO2 or chest rise. ETT removed. Pt hand ventilated with oral airway easily. 5th DL attempt (5th attempt total; 3rd DL by Kingsley Callander, CRNA) with glidescope by Kingsley Callander, CRNA. Identical view of epiglottis only. Bougie stylette used to place ETT thru cords. Vocal cords never visualized, however + ETCO2, =BBS present, B chest rise. ETT secured.    Performed by: Rossie Muskrat

## 2011-11-03 NOTE — Preoperative (Signed)
Beta Blockers   Reason not to administer Beta Blockers:pt does not take b blockers

## 2011-11-03 NOTE — Transfer of Care (Signed)
Immediate Anesthesia Transfer of Care Note  Patient: Kristina Perkins  Procedure(s) Performed:  TOTAL KNEE ARTHROPLASTY - Primary left total knee with revision components   Patient Location: PACU  Anesthesia Type: General  Level of Consciousness: awake, alert  and oriented  Airway & Oxygen Therapy: Patient Spontanous Breathing and Patient connected to face mask oxygen  Post-op Assessment: Report given to PACU RN, Post -op Vital signs reviewed and stable and Patient moving all extremities X 4  Post vital signs: Reviewed and stable  Complications: No apparent anesthesia complications

## 2011-11-03 NOTE — Anesthesia Postprocedure Evaluation (Signed)
  Anesthesia Post-op Note  Patient: Kristina Perkins  Procedure(s) Performed:  TOTAL KNEE ARTHROPLASTY - Primary left total knee with revision components   Patient Location: PACU  Anesthesia Type: GA combined with regional for post-op pain  Level of Consciousness: awake, alert  and oriented  Airway and Oxygen Therapy: Patient Spontanous Breathing  Post-op Pain: mild  Post-op Assessment: Post-op Vital signs reviewed, Patient's Cardiovascular Status Stable, Respiratory Function Stable, Patent Airway, No signs of Nausea or vomiting and Pain level controlled  Post-op Vital Signs: Reviewed and stable  Complications: No apparent anesthesia complications

## 2011-11-03 NOTE — Progress Notes (Signed)
ANTICOAGULATION CONSULT NOTE - Initial Consult  Pharmacy Consult for Coumadin Indication: VTE prophylaxis  Allergies  Allergen Reactions  . Sulfa Antibiotics Other (See Comments)    Blood in urine    Patient Measurements: Height: 5\' 6"  (167.6 cm) Weight: 263 lb (119.296 kg) IBW/kg (Calculated) : 59.3    Vital Signs: Temp: 97.7 F (36.5 C) (12/11 1705) Temp src: Axillary (12/11 1705) BP: 161/82 mmHg (12/11 1705) Pulse Rate: 90  (12/11 1705)  Labs: No results found for this basename: HGB:2,HCT:3,PLT:3,APTT:3,LABPROT:3,INR:3,HEPARINUNFRC:3,CREATININE:3,CKTOTAL:3,CKMB:3,TROPONINI:3 in the last 72 hours Estimated Creatinine Clearance: 92.2 ml/min (by C-G formula based on Cr of 0.65).  Medical History: Past Medical History  Diagnosis Date  . Difficult intubation     had trouble with previous surgery while here  . Hypertension   . Degenerative joint disease   . Asthma     hx of  . Sleep apnea     uses CPAP sleep study done at [facility]  . Melanoma     22 years ago  . Cataracts, bilateral   . Pneumonia     no recent history of  . Depression   . Pneumothorax, traumatic     38 years ago  . Traumatic injury of the kidney     38 years ago  . Multiple closed fractures of ribs of left side     38 years ago  . Fracture, sternum closed     38 years ago  . Fracture of left clavicle     38 years ago    Medications:  Prescriptions prior to admission  Medication Sig Dispense Refill  . acetaminophen (TYLENOL) 500 MG tablet Take 1,000 mg by mouth every 6 (six) hours as needed. pain       . ALPRAZolam (XANAX) 0.25 MG tablet Take 0.25 mg by mouth 3 (three) times daily as needed. For anxiety       . diphenhydramine-acetaminophen (TYLENOL PM) 25-500 MG TABS Take 1 tablet by mouth at bedtime as needed. For sleep        . hydrochlorothiazide (MICROZIDE) 12.5 MG capsule Take 25 mg by mouth daily.       . meloxicam (MOBIC) 15 MG tablet Take 15 mg by mouth daily.           Assessment: 65 year old woman s/p TKR to received Warfarin post op for VTE prophylaxis Goal of Therapy:  INR 2-3   Plan: Warfarin 5mg  po x 1 today.  Coumadin book and video.  Daily protimes.  Mickeal Skinner 11/03/2011,5:32 PM

## 2011-11-03 NOTE — Interval H&P Note (Signed)
History and Physical Interval Note:  11/03/2011 7:31 AM  Kristina Perkins  has presented today for surgery, with the diagnosis of LEFT KNEE DEGENERATIVE JOINT DISEASE  The various methods of treatment have been discussed with the patient and family. After consideration of risks, benefits and other options for treatment, the patient has consented to  Procedure(s): TOTAL KNEE ARTHROPLASTY as a surgical intervention .  The patients' history has been reviewed, patient examined, no change in status, stable for surgery.  I have reviewed the patients' chart and labs.  Questions were answered to the patient's satisfaction.     Bird Swetz G

## 2011-11-04 ENCOUNTER — Encounter (HOSPITAL_COMMUNITY): Payer: Self-pay | Admitting: General Practice

## 2011-11-04 HISTORY — PX: TOTAL KNEE ARTHROPLASTY: SHX125

## 2011-11-04 LAB — PROTIME-INR
INR: 1.18 (ref 0.00–1.49)
Prothrombin Time: 15.3 seconds — ABNORMAL HIGH (ref 11.6–15.2)

## 2011-11-04 LAB — BASIC METABOLIC PANEL
CO2: 31 mEq/L (ref 19–32)
Chloride: 101 mEq/L (ref 96–112)
GFR calc Af Amer: >90 mL/min (ref >90)
Potassium: 3.5 mEq/L (ref 3.5–5.1)

## 2011-11-04 LAB — CBC
Platelets: 189 10*3/uL (ref 150–400)
RBC: 3.78 MIL/uL — ABNORMAL LOW (ref 3.87–5.11)
RDW: 14.5 % (ref 11.5–15.5)
WBC: 6.8 10*3/uL (ref 4.0–10.5)

## 2011-11-04 MED ORDER — PNEUMOCOCCAL VAC POLYVALENT 25 MCG/0.5ML IJ INJ
0.5000 mL | INJECTION | INTRAMUSCULAR | Status: AC
  Administered 2011-11-05: 0.5 mL via INTRAMUSCULAR
  Filled 2011-11-04: qty 0.5

## 2011-11-04 MED ORDER — CEFAZOLIN SODIUM-DEXTROSE 2-3 GM-% IV SOLR
2.0000 g | Freq: Three times a day (TID) | INTRAVENOUS | Status: AC
  Administered 2011-11-04: 2 g via INTRAVENOUS
  Filled 2011-11-04: qty 50

## 2011-11-04 MED ORDER — WARFARIN SODIUM 5 MG PO TABS
5.0000 mg | ORAL_TABLET | Freq: Once | ORAL | Status: AC
Start: 2011-11-04 — End: 2011-11-04
  Administered 2011-11-04: 5 mg via ORAL
  Filled 2011-11-04: qty 1

## 2011-11-04 MED ORDER — INFLUENZA VIRUS VACC SPLIT PF IM SUSP
0.5000 mL | INTRAMUSCULAR | Status: AC
  Administered 2011-11-05: 0.5 mL via INTRAMUSCULAR
  Filled 2011-11-04: qty 0.5

## 2011-11-04 NOTE — Progress Notes (Signed)
Subjective: 1 Day Post-Op Procedure(s) (LRB): TOTAL KNEE ARTHROPLASTY (Left)  Activity level: oob with pt Diet tolerance:eating Voiding with or without catheter:voiding Patient reports pain as 3 on 0-10 scale.    Objective: Vital signs in last 24 hours: Temp:  [97.1 F (36.2 C)-98.8 F (37.1 C)] 98.8 F (37.1 C) (12/12 0525) Pulse Rate:  [72-101] 101  (12/12 0525) Resp:  [12-22] 18  (12/12 0525) BP: (125-170)/(63-95) 141/63 mmHg (12/12 0525) SpO2:  [95 %-100 %] 97 % (12/12 0525) FiO2 (%):  [2 %] 2 % (12/11 1705) Weight:  [119.296 kg (263 lb)] 263 lb (119.296 kg) (12/11 1705)  Intake/Output from previous day: 12/11 0701 - 12/12 0700 In: 2950 [P.O.:150; I.V.:2750; IV Piggyback:50] Out: 4225 [Urine:3950; Drains:225; Blood:50] Intake/Output this shift:     Basename 11/04/11 0545  HGB 10.8*    Basename 11/04/11 0545  WBC 6.8  RBC 3.78*  HCT 33.9*  PLT 189   No results found for this basename: NA:2,K:2,CL:2,CO2:2,BUN:2,CREATININE:2,GLUCOSE:2,CALCIUM:2 in the last 72 hours  Basename 11/04/11 0545  LABPT --  INR 1.18    Neurologically intact ABD soft Neurovascular intact Sensation intact distally Intact pulses distally Dorsiflexion/Plantar flexion intact No cellulitis present Compartment soft  Assessment/Plan: 1 Day Post-Op Procedure(s) (LRB): TOTAL KNEE ARTHROPLASTY (Left) Advance diet Up with therapy D/C IV fluids Will order cpap  Kristina Perkins R 11/04/2011, 8:08 AM

## 2011-11-04 NOTE — Progress Notes (Signed)
Occupational Therapy Evaluation Patient Details Name: Kristina Perkins MRN: [id number] DOB: [birth year] Today's Date: 11/04/2011  Problem List:  Patient Active Problem List  Diagnoses  . Left knee DJD    Past Medical History:  Past Medical History  Diagnosis Date  . Difficult intubation     had trouble with previous surgery while here  . Hypertension   . Degenerative joint disease   . Asthma     hx of  . Sleep apnea     uses CPAP sleep study done at [facility]  . Melanoma     22 years ago  . Cataracts, bilateral   . Pneumonia     no recent history of  . Depression   . Pneumothorax, traumatic     38 years ago  . Traumatic injury of the kidney     38 years ago  . Multiple closed fractures of ribs of left side     38 years ago  . Fracture, sternum closed     38 years ago  . Fracture of left clavicle     38 years ago   Past Surgical History:  Past Surgical History  Procedure Date  . Tonsillectomy   . Adenoidectomy   . Cesarean section   . Shoulder arthroscopy     left  . Cholecystectomy     OT Assessment/Plan/Recommendation OT Assessment Clinical Impression Statement: Pt s/p L TKA and demonstrates with decreased I with ADLs/functional transfers.  Will benefit from acute OT to increase I with ADLs/functional transfers in prep for d/c home OT Recommendation/Assessment: Patient will need skilled OT in the acute care venue OT Problem List: Decreased activity tolerance;Decreased knowledge of use of DME or AE;Pain OT Therapy Diagnosis : Acute pain OT Plan OT Frequency: Min 2X/week OT Treatment/Interventions: Self-care/ADL training;DME and/or AE instruction;Therapeutic activities;Patient/family education OT Recommendation Follow Up Recommendations: None Equipment Recommended: None recommended by OT Individuals Consulted Consulted and Agree with Results and Recommendations: Patient OT Goals Acute Rehab OT Goals OT Goal Formulation: With patient Time For Goal Achievement:  7 days ADL Goals Pt Will Perform Grooming: with modified independence;Standing at sink ADL Goal: Grooming - Progress: Other (comment) Pt Will Perform Lower Body Bathing: with modified independence;Sit to stand from chair;Sit to stand from bed ADL Goal: Lower Body Bathing - Progress: Other (comment) Pt Will Perform Lower Body Dressing: with modified independence;Sit to stand from chair;Sit to stand from bed ADL Goal: Lower Body Dressing - Progress: Other (comment) Pt Will Transfer to Toilet: with modified independence;with DME;Ambulation;3-in-1 ADL Goal: Toilet Transfer - Progress: Other (comment) Pt Will Perform Tub/Shower Transfer: Shower transfer;with modified independence;Ambulation;with DME ADL Goal: Tub/Shower Transfer - Progress: Other (comment)  OT Evaluation Precautions/Restrictions  Precautions Required Braces or Orthoses: No Restrictions Weight Bearing Restrictions: Yes LLE Weight Bearing: Weight bearing as tolerated Prior Functioning Home Living Lives With: Spouse Receives Help From: Family;Friend(s) Type of Home: House Home Layout: Laundry or work area in basement;Able to live on main level with bedroom/bathroom;Two level Alternate Level Stairs-Rails: Right Alternate Level Stairs-Number of Steps: 12 Home Access: Stairs to enter Entrance Stairs-Rails: None Entrance Stairs-Number of Steps: 2 Bathroom Shower/Tub: Walk-in shower;Door [employer] Toilet: Handicapped height Bathroom Accessibility: Yes How Accessible: Accessible via walker Home Adaptive Equipment: Walker - rolling Prior Function Level of Independence: Independent with basic ADLs;Independent with homemaking with ambulation;Independent with gait;Independent with transfers Able to Take Stairs?: Yes Driving: Yes Vocation: 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 Eating/Feeding: Simulated;Independent Where Assessed - Eating/Feeding: Edge  of bed Grooming: Simulated;Set  up Where Assessed - Grooming: Sitting, bed;Unsupported Upper Body Bathing: Simulated;Set up Where Assessed - Upper Body Bathing: Sitting, bed;Unsupported Lower Body Bathing: Simulated;Minimal assistance Where Assessed - Lower Body Bathing: Sit to stand from bed Upper Body Dressing: Simulated;Set up Where Assessed - Upper Body Dressing: Sitting, bed;Unsupported Lower Body Dressing: Simulated;Moderate assistance Where Assessed - Lower Body Dressing: Sit to stand from bed Toilet Transfer: Performed;Minimal assistance Toilet Transfer Method: Ambulating Toilet Transfer Equipment: Bedside commode Toileting - Clothing Manipulation: Performed;Supervision/safety Toileting - Clothing Manipulation Details (indicate cue type and reason): Pt pulled gown up  Where Assessed - Toileting Clothing Manipulation: Standing Toileting - Hygiene: Performed;Independent Where Assessed - Toileting Hygiene: Sit on 3-in-1 or toilet Tub/Shower Transfer: Not assessed Equipment Used: Rolling walker Vision/Perception    Cognition Cognition Arousal/Alertness: Awake/alert Overall Cognitive Status: Appears within functional limits for tasks assessed Orientation Level: Oriented X4 Sensation/Coordination   Extremity Assessment RUE Assessment RUE Assessment: Within Functional Limits LUE Assessment LUE Assessment: Within Functional Limits Mobility  Bed Mobility Bed Mobility: Yes Supine to Sit: 5: Supervision Sitting - Scoot to Edge of Bed: 5: Supervision Sit to Supine - Right: 5: Supervision Transfers Transfers: Yes Sit to Stand: 4: Min assist;From elevated surface;With upper extremity assist;From bed;From chair/3-in-1 Sit to Stand Details (indicate cue type and reason): Used L knee immobilizer to stabilize LLE as recommended when speaking with PT earlier Stand to Sit: 4: Min assist;With upper extremity assist;To bed Exercises   End of Session OT - End of Session Equipment Utilized During Treatment: Gait  belt;Left knee immobilizer Activity Tolerance: Patient tolerated treatment well Patient left: in bed;with call bell in reach;with family/visitor present General Behavior During Session: [facility] for tasks performed Cognition: [facility] for tasks performed   Cipriano Mile 11/04/2011, 2:53 PM  11/04/2011 Cipriano Mile OTR/L Pager [phone] Office [phone]

## 2011-11-04 NOTE — Progress Notes (Addendum)
Pt is s/p L TKR. Dec ROM LLE related to L TKR. KI per order. CPM per order. Pt is to WBAT with RW. Pt has an ace dressing to L knee that is stained with small amount of bldy drainage at the top. Ice prn L knee. Lungs CTA but noted to be diminished in the bases. Pt repts prod cough with small amount of clear phlegm that is occasionally bld tinged. Pt sats 97% 2lpm. Pt performs IS per order. Pt is to void since foley d/ced this AM. Pt denies nausea or vomiting and repts passing gas. Pt repts tolerating diet fair to good. No s/sx cardiac or resp distress and no c/o such. Vital signs stable. Pt has a hemovac to L knee that is draining only small amount of bldy drainage.

## 2011-11-04 NOTE — Progress Notes (Signed)
Physical Therapy Evaluation Patient Details Name: ATALIA LITZINGER MRN: [id number] DOB: [birth year] Today's Date: 11/04/2011  Problem List:  Patient Active Problem List  Diagnoses  . Left knee DJD    Past Medical History:  Past Medical History  Diagnosis Date  . Difficult intubation     had trouble with previous surgery while here  . Hypertension   . Degenerative joint disease   . Asthma     hx of  . Sleep apnea     uses CPAP sleep study done at [facility]  . Melanoma     22 years ago  . Cataracts, bilateral   . Pneumonia     no recent history of  . Depression   . Pneumothorax, traumatic     38 years ago  . Traumatic injury of the kidney     38 years ago  . Multiple closed fractures of ribs of left side     38 years ago  . Fracture, sternum closed     38 years ago  . Fracture of left clavicle     38 years ago   Past Surgical History:  Past Surgical History  Procedure Date  . Tonsillectomy   . Adenoidectomy   . Cesarean section   . Shoulder arthroscopy     left  . Cholecystectomy     PT Assessment/Plan/Recommendation PT Assessment Clinical Impression Statement: Pt is 65 yo female with L TKA, who presents today with left quad insufficiency and dizziness which limited mobility.  Pt's O2 sats dropped to 85% on RA and O2 was reapplied, BP was stable.  PT will follow acutely to address ROM and strength of the LLE as well as mobility and exercise plan.  Recommend HHPT at d/c, pt has needed equipment. PT Recommendation/Assessment: Patient will need skilled PT in the acute care venue PT Problem List: Decreased strength;Decreased range of motion;Decreased activity tolerance;Decreased balance;Decreased mobility;Decreased coordination;Decreased knowledge of use of DME;Decreased knowledge of precautions;Pain Barriers to Discharge: None PT Therapy Diagnosis : Difficulty walking;Abnormality of gait;Acute pain PT Plan PT Frequency: 7X/week PT Treatment/Interventions: DME  instruction;Gait training;Stair training;Functional mobility training;Therapeutic activities;Therapeutic exercise;Balance training;Patient/family education PT Recommendation Recommendations for Other Services: OT consult Follow Up Recommendations: Home health PT Equipment Recommended: None recommended by PT PT Goals  Acute Rehab PT Goals PT Goal Formulation: With patient Time For Goal Achievement: 7 days Pt will go Supine/Side to Sit: with modified independence;with HOB 0 degrees;Other (comment) (with no rail) PT Goal: Supine/Side to Sit - Progress: Progressing toward goal Pt will go Sit to Supine/Side: with min assist;with HOB 0 degrees (with no rail, min A to left leg) PT Goal: Sit to Supine/Side - Progress: Other (comment) (NT today) Pt will go Sit to Stand: with modified independence PT Goal: Sit to Stand - Progress: Progressing toward goal Pt will go Stand to Sit: with modified independence PT Goal: Stand to Sit - Progress: Progressing toward goal Pt will Transfer Bed to Chair/Chair to Bed: with modified independence;Other (comment) (with RW) PT Transfer Goal: Bed to Chair/Chair to Bed - Progress: Progressing toward goal Pt will Ambulate: >150 feet;with modified independence;with rolling walker PT Goal: Ambulate - Progress: Other (comment) (NT today) Pt will Go Up / Down Stairs: 1-2 stairs;with rolling walker;with min assist PT Goal: Up/Down Stairs - Progress: Other (comment) (NT today) Pt will Perform Home Exercise Program: with supervision, verbal cues required/provided PT Goal: Perform Home Exercise Program - Progress: Progressing toward goal  PT Evaluation Precautions/Restrictions  Precautions Required Braces or Orthoses:  No Restrictions Weight Bearing Restrictions: Yes LLE Weight Bearing: Weight bearing as tolerated Prior Functioning  Home Living Lives With: Spouse Receives Help From: Family;Friend(s) (has a friend that will help when spouse is working) Type of Home:  [employer] Layout: Laundry or work area in basement;Able to live on main level with bedroom/bathroom;Two level Alternate Level Stairs-Rails: Right Alternate Level Stairs-Number of Steps: 12 Home Access: Stairs to enter Entrance Stairs-Rails: None Entrance Stairs-Number of Steps: 2 Bathroom Shower/Tub: Walk-in shower;Door Bathroom Toilet: Handicapped height Bathroom Accessibility: Yes (can take RW in sideways) How Accessible: Accessible via walker (see previous note) Home Adaptive Equipment: Walker - rolling Prior Function Level of Independence: Independent with basic ADLs;Independent with homemaking with ambulation;Independent with gait;Independent with transfers Able to Take Stairs?: Yes Driving: Yes Vocation: 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 Cognition Cognition Arousal/Alertness: Awake/alert Overall Cognitive Status: Appears within functional limits for tasks assessed Orientation Level: Oriented X4 Sensation/Coordination Sensation Light Touch: Appears Intact Stereognosis: Appears Intact Hot/Cold: Appears Intact Proprioception: Impaired by gross assessment Additional Comments: decreased proprioception LLE Coordination Gross Motor Movements are Fluid and Coordinated: No Fine Motor Movements are Fluid and Coordinated: Yes Coordination and Movement Description: inability to actively move the LLE Extremity Assessment RUE Assessment RUE Assessment: Within Functional Limits LUE Assessment LUE Assessment: Within Functional Limits RLE Assessment RLE Assessment: Exceptions to [facility] RLE Strength RLE Overall Strength Comments: pt with right knee OA as well, grossly 4/5 quad strength LLE Assessment LLE Assessment: Not tested (due to sx, pt unable to left leg against gravity) Mobility (including Balance) Bed Mobility Bed Mobility: Yes Rolling Left: 4: Min assist;With rail Rolling Left Details (indicate cue type and reason): min A given to LLE to  roll Left Sidelying to Sit: 4: Min assist;With rails;HOB elevated (comment degrees) (22) Left Sidelying to Sit Details (indicate cue type and reason): min A to LLE to slide it off bed Sitting - Scoot to Edge of Bed: 5: Supervision Sitting - Scoot to [employer] of Bed Details (indicate cue type and reason): pt able to scoot to EOB Transfers Transfers: Yes Sit to Stand: 4: Min assist;From elevated surface;With upper extremity assist;From bed;From chair/3-in-1 Sit to Stand Details (indicate cue type and reason): Min A to stabilize RW as pt had to push on it to achieve standing, unabl to push from bed.  This is attributed toleft knee weakness from surgery as well as weakness of the right knee (pre-existing OA) Stand to Sit: 4: Min assist;With armrests;To chair/3-in-1 Stand to Sit Details: vc's to place hands and slide LLE out Stand Pivot Transfers: Other (comment);4: Min assist (with RW) Stand Pivot Transfer Details (indicate cue type and reason): pt pivoted from bed to 3-in-1 and then 3-in-1 to chair, with RW, min A given for support as pt's L knee buckling. Pt strong enough upper body to compensate for weakness Ambulation/Gait Ambulation/Gait: No (due to pt slightly dizzy and left knee buckling) Stairs: No Wheelchair Mobility Wheelchair Mobility: No  Posture/Postural Control Posture/Postural Control: No significant limitations Balance Balance Assessed: No Exercise  Total Joint Exercises Ankle Circles/Pumps: AROM;Both;20 reps;Seated Quad Sets: Both;AROM;Strengthening;10 reps;Seated;Limitations Quad Sets Limitations: no active quad contraction this morning with leg in extension.  From flexed position, pt able to activate 1/5 End of Session PT - End of Session Equipment Utilized During Treatment: Gait belt Activity Tolerance: Other (comment) (left quad insufficiency and dizziness) Patient left: in chair;with call bell in reach;Other (comment) (ice applied) Nurse Communication: Mobility status  for transfers General Behavior During Session:  WFL for tasks performed Cognition: [facility] for tasks performed  Kiyra Slaubaugh, [place]  [phone] 11/04/2011, 10:40 AM

## 2011-11-04 NOTE — Progress Notes (Signed)
ANTICOAGULATION CONSULT NOTE - Follow Up Consult  Pharmacy Consult for Warfarin Indication: VTE px s/p L TKR  Allergies  Allergen Reactions  . Sulfa Antibiotics Other (See Comments)    Blood in urine    Patient Measurements: Height: 5\' 6"  (167.6 cm) Weight: 263 lb (119.296 kg) IBW/kg (Calculated) : 59.3   Vital Signs: Temp: 98.8 F (37.1 C) (12/12 0525) Temp src: Oral (12/12 0525) BP: 138/79 mmHg (12/12 1014) Pulse Rate: 101  (12/12 0525)  Labs:  Basename 11/04/11 0545  HGB 10.8*  HCT 33.9*  PLT 189  APTT --  LABPROT 15.3*  INR 1.18  HEPARINUNFRC --  CREATININE 0.68  CKTOTAL --  CKMB --  TROPONINI --   Estimated Creatinine Clearance: 92.2 ml/min (by C-G formula based on Cr of 0.68).   Medications:  Prescriptions prior to admission  Medication Sig Dispense Refill  . acetaminophen (TYLENOL) 500 MG tablet Take 1,000 mg by mouth every 6 (six) hours as needed. pain       . ALPRAZolam (XANAX) 0.25 MG tablet Take 0.25 mg by mouth 3 (three) times daily as needed. For anxiety       . diphenhydramine-acetaminophen (TYLENOL PM) 25-500 MG TABS Take 1 tablet by mouth at bedtime as needed. For sleep        . hydrochlorothiazide (MICROZIDE) 12.5 MG capsule Take 25 mg by mouth daily.       . meloxicam (MOBIC) 15 MG tablet Take 15 mg by mouth daily.         Scheduled:    . ceFAZolin (ANCEF) IV  2 g Intravenous Q8H  . docusate sodium  100 mg Oral BID  . enoxaparin  30 mg Subcutaneous Q12H  . hydrochlorothiazide  25 mg Oral Daily  . patient's guide to using coumadin book   Does not apply Once  . warfarin  5 mg Oral ONCE-1800  . warfarin   Does not apply Once  . DISCONTD: ceFAZolin (ANCEF) IV  2 g Intravenous Q8H  . DISCONTD: chlorhexidine  60 mL Topical Once    Assessment: 65 y.o. F on warfarin for anticoagulation s/p L TKR on 12/11 with a SUBtherapeutic INR this a.m. (INR 1.18). No s/sx of bleeding noted. The patient is also on Lovenox 30 mg q12h until INR >/= 1.8. The  patient was educated on warfarin today.   Goal of Therapy:  INR 2-3   Plan:  1) Warfarin 5 mg x 1 dose at 1800 today 2) Will continue to monitor for any signs/symptoms of bleeding and will follow up with PT/INR in the a.m.   Rolley Sims 11/04/2011,10:47 AM

## 2011-11-05 ENCOUNTER — Encounter (HOSPITAL_COMMUNITY): Payer: Self-pay | Admitting: Orthopaedic Surgery

## 2011-11-05 LAB — CBC
Hemoglobin: 10.4 g/dL — ABNORMAL LOW (ref 12.0–15.0)
MCH: 28.3 pg (ref 26.0–34.0)
MCHC: 31.6 g/dL (ref 30.0–36.0)
Platelets: 173 10*3/uL (ref 150–400)
RDW: 14.5 % (ref 11.5–15.5)

## 2011-11-05 LAB — PROTIME-INR
INR: 1.46 (ref 0.00–1.49)
Prothrombin Time: 18 seconds — ABNORMAL HIGH (ref 11.6–15.2)

## 2011-11-05 MED ORDER — METHOCARBAMOL 500 MG PO TABS: 500.0000 mg | ORAL_TABLET | Freq: Four times a day (QID) | ORAL | Status: AC | PRN

## 2011-11-05 MED ORDER — WARFARIN SODIUM 5 MG PO TABS: ORAL_TABLET | ORAL | Status: DC

## 2011-11-05 MED ORDER — WARFARIN SODIUM 5 MG PO TABS
5.0000 mg | ORAL_TABLET | Freq: Once | ORAL | Status: AC
  Administered 2011-11-05: 5 mg via ORAL
  Filled 2011-11-05: qty 1

## 2011-11-05 MED ORDER — OXYCODONE-ACETAMINOPHEN 5-325 MG PO TABS: 1.0000 | ORAL_TABLET | ORAL | Status: AC | PRN

## 2011-11-05 NOTE — Discharge Summary (Signed)
  D/c #[id number]

## 2011-11-05 NOTE — Discharge Instructions (Signed)
Home Health for therapy  For 4 weeks and coumadin protocol for 2 weeks

## 2011-11-05 NOTE — Progress Notes (Signed)
Occupational Therapy Treatment Patient Details Name: Kristina Perkins MRN: [id number] DOB: 07/14/1946 Today's Date: 11/05/2011  OT Assessment/Plan OT Assessment/Plan Comments on Treatment Session: Pt demonstrating increased I with functional mobility since last session. Progressing towards goals. OT Plan: Discharge plan remains appropriate OT Frequency: Min 2X/week Follow Up Recommendations: None Equipment Recommended: None recommended by OT OT Goals Acute Rehab OT Goals OT Goal Formulation: With patient Time For Goal Achievement: 7 days ADL Goals Pt Will Perform Grooming: with modified independence;Standing at sink ADL Goal: Grooming - Progress: Progressing toward goals Pt Will Perform Lower Body Bathing: with modified independence;Sit to stand from chair;Sit to stand from bed ADL Goal: Lower Body Bathing - Progress: Progressing toward goals Pt Will Perform Lower Body Dressing: with modified independence;Sit to stand from chair;Sit to stand from bed ADL Goal: Lower Body Dressing - Progress: Progressing toward goals Pt Will Transfer to Toilet: with modified independence;with DME;Ambulation;3-in-1 ADL Goal: Toilet Transfer - Progress: Progressing toward goals Pt Will Perform Tub/Shower Transfer: Shower transfer;with modified independence;Ambulation;with DME ADL Goal: Tub/Shower Transfer - Progress: Not addressed  OT Treatment Precautions/Restrictions  Restrictions Weight Bearing Restrictions: Yes LLE Weight Bearing: Weight bearing as tolerated   ADL ADL Grooming: Performed;Wash/dry face;Teeth care;Brushing hair Where Assessed - Grooming: Sitting, chair;Unsupported Upper Body Bathing: Performed;Set up;Chest;Right arm;Left arm;Abdomen Where Assessed - Upper Body Bathing: Sitting, chair;Unsupported Lower Body Bathing: Performed;Minimal assistance Where Assessed - Lower Body Bathing: Sit to stand from chair;Unsupported Upper Body Dressing: Performed;Set up Where Assessed - Upper  Body Dressing: Sitting, chair;Unsupported Toilet Transfer: Performed;Supervision/safety Toilet Transfer Method: Proofreader: Bedside commode Toileting - Clothing Manipulation: Performed;Independent Where Assessed - Toileting Clothing Manipulation: Standing Toileting - Hygiene: Performed;Independent Where Assessed - Toileting Hygiene: Sit on 3-in-1 or toilet Equipment Used: Rolling walker;Leg lifter Mobility  Bed Mobility Bed Mobility: Yes Supine to Sit: 5: Supervision;With rails;HOB elevated (Comment degrees) (30 degrees) Supine to Sit Details (indicate cue type and reason): Pt used leg lifter on LLE for support Sitting - Scoot to Edge of Bed: 5: Supervision Transfers Transfers: Yes Sit to Stand: 4: Min assist;From elevated surface;With upper extremity assist;From bed;From chair/3-in-1 Stand to Sit: 5: Supervision;With upper extremity assist;To chair/3-in-1 Exercises    End of Session OT - End of Session Activity Tolerance: Patient tolerated treatment well Patient left: in chair;with call bell in reach General Behavior During Session: [facility] for tasks performed Cognition: [facility] for tasks performed  Cipriano Mile  11/05/2011, 10:04 AM 11/05/2011 Cipriano Mile OTR/L Pager [phone] Office [phone]

## 2011-11-05 NOTE — Progress Notes (Signed)
Subjective: 2 Days Post-Op Procedure(s) (LRB): TOTAL KNEE ARTHROPLASTY (Left)  Activity level: OOB with PT and ambulatory Diet tolerance:regular Voiding with or without catheter:without Patient reports pain as mild.    Objective: Vital signs in last 24 hours: Temp:  [98.1 F (36.7 C)-100.4 F (38 C)] 98.4 F (36.9 C) (12/13 0519) Pulse Rate:  [91-113] 93  (12/13 0519) Resp:  [20] 20  (12/13 0519) BP: (137-151)/(67-79) 137/73 mmHg (12/13 0519) SpO2:  [86 %-96 %] 95 % (12/13 0519)  Intake/Output from previous day: 12/12 0701 - 12/13 0700 In: 760 [P.O.:360; I.V.:350; IV Piggyback:50] Out: 200 [Drains:200] Intake/Output this shift:     Basename 11/05/11 0550 11/04/11 0545  HGB 10.4* 10.8*    Basename 11/05/11 0550 11/04/11 0545  WBC 7.5 6.8  RBC 3.67* 3.78*  HCT 32.9* 33.9*  PLT 173 189    Basename 11/04/11 0545  NA 140  K 3.5  CL 101  CO2 31  BUN 12  CREATININE 0.68  GLUCOSE 158*  CALCIUM 8.5    Basename 11/05/11 0550 11/04/11 0545  LABPT -- --  INR 1.46 1.18    Neurologically intact ABD soft Neurovascular intact Sensation intact distally Intact pulses distally Dorsiflexion/Plantar flexion intact Incision: no drainage  Assessment/Plan: 2 Days Post-Op Procedure(s) (LRB): TOTAL KNEE ARTHROPLASTY (Left) Up with therapy D/C IV fluids Plan for discharge tomorrow  Kristina Perkins 11/05/2011, 1:29 PM

## 2011-11-05 NOTE — Progress Notes (Signed)
ANTICOAGULATION CONSULT NOTE - Follow Up Consult  Pharmacy Consult for Warfarin Indication: VTE px s/p L TKR  Allergies  Allergen Reactions  . Sulfa Antibiotics Other (See Comments)    Blood in urine    Patient Measurements: Height: 5\' 6"  (167.6 cm) Weight: 263 lb (119.296 kg) IBW/kg (Calculated) : 59.3   Vital Signs: Temp: 98.4 F (36.9 C) (12/13 0519) Temp src: Oral (12/13 0519) BP: 137/73 mmHg (12/13 0519) Pulse Rate: 93  (12/13 0519)  Labs:  Basename 11/05/11 0550 11/04/11 0545  HGB 10.4* 10.8*  HCT 32.9* 33.9*  PLT 173 189  APTT -- --  LABPROT 18.0* 15.3*  INR 1.46 1.18  HEPARINUNFRC -- --  CREATININE -- 0.68  CKTOTAL -- --  CKMB -- --  TROPONINI -- --   Estimated Creatinine Clearance: 92.2 ml/min (by C-G formula based on Cr of 0.68).  Assessment: 65 y.o. F on warfarin for anticoagulation s/p L TKR on 12/11 with a SUBtherapeutic INR this a.m. although trending up nicely. No s/sx of bleeding noted. The patient is also on Lovenox 30 mg q12h until INR >/= 1.8.  Goal of Therapy:  INR 2-3   Plan:  1) Warfarin 5 mg x 1 dose at 1800 today 2) Will continue to monitor for any signs/symptoms of bleeding and will follow up with PT/INR in the a.m.   Lavonia Dana 11/05/2011,11:56 AM

## 2011-11-05 NOTE — Discharge Summary (Signed)
NAMEJORDEN, Kristina Perkins                ACCOUNT NO.:  [id number]  MEDICAL RECORD NO.:  [id number]  LOCATION:  5030                         FACILITY:  [facility]  PHYSICIAN:  Lubertha Basque. Dalldorf, M.D.DATE OF BIRTH:  [birth year]  DATE OF ADMISSION:  11/03/2011 DATE OF DISCHARGE:  11/06/2011                        DISCHARGE SUMMARY - REFERRING   ADMITTING DIAGNOSES: 1. Left knee end-stage degenerative joint disease. 2. Sleep apnea.  DISCHARGE DIAGNOSES: 1. Left knee end-stage degenerative joint disease. 2. Sleep apnea.  OPERATIONS:  Left total knee replacement.  BRIEF HISTORY:  The patient is a 65 year old patient well known to our practice who is complaining of increasing pain with ambulation in her left knee.  She has been on many anti-inflammatory medicines, nonsteroidal type, has had corticosteroid injections, and is having night pain and increasing pain with ambulation to the point that it is interfering with her activities of daily living.  We have discussed treatment options with her that being total knee replacement.  Her x- rays revealed bone-on-bone DJD.  PERTINENT LABORATORY AND X-RAY FINDINGS:  Sodium 140, potassium 3.5, BUN 12, creatinine 0.68, calcium 8.5, glucose 158.  WBC 7.5, hemoglobin 10.4, hematocrit 32.9, platelets 173.  Last INR 1.46.  COURSE IN THE HOSPITAL:  The patient was admitted postoperatively.  The orders of control set sheet for total knee replacement protocol was ordered.  She had DVT prophylaxis per pharmacy with Lovenox bridge and Coumadin, pulsating stockings on her lower extremities.  Appropriate antibiotic therapy 3 doses perioperatively and we used Ancef or Kefzol. She was kept on her home medications.  Regular diet as tolerated. Antiemetics, antispasmodics, and pain medicine both IV and p.o.  First day postoperatively, vital signs were stable.  Blood pressure was within normal limits.  Temperature is afebrile.  Blood count was good. Dressing with  minimal to no drainage, drain was in place.  Her lungs were clear.  Cardiac, regular rate and rhythm.  Abdomen was soft.  She was eating and voiding well once the catheter was removed.  Second day postoperatively, the drain was removed from her knee.  The dressing was changed.  The wound was benign.  Calf soft and nontender.  Additional vital signs were stable.  Lungs were clear.  Cardiac, regular rate and rhythm.  No sign of infection.  We had arranged for home care agency to come to her house for Coumadin management for 2 weeks total with an INR between 2.0 and 3.0, and then also physical therapy.  She progressed well with therapy and was cleared to go home and was discharged.  CONDITION ON DISCHARGE:  Improved.  DISCHARGE INSTRUCTIONS:  Followup should be on a regular heart healthy diet.  May change the dressing on Sunday and then every other day after that on her left knee.  May be weightbearing as tolerated.  She will be on all of her home medicines available in the epic discharge med management sheets.  Any sign of infection, she is instructed to call the office at [phone], otherwise to be seen there in 11-12 days for postop check which has already been prearranged.  Home Health and I believe it has been advanced home care agency will provide inhouse in  her own home physical therapy, as well as blood draws for Coumadin management for total of 2 weeks with an INR target between 2.0 and 3.0.  Once again, she is kept on all of her usual medicines including her CPAP machine and 3 prescriptions are given to her on discharge, one for methocarbamol, one for Percocet generic, and one for Coumadin 5 mg dose to be regulated by pharmacy Coumadin protocol.     Kristina Perkins, P.A.   ______________________________ Lubertha Basque. Jerl Santos, M.D.    MC/MEDQ  D:  11/05/2011  T:  11/05/2011  Job:  [id number]

## 2011-11-05 NOTE — Progress Notes (Signed)
Physical Therapy Treatment Patient Details Name: Kristina Perkins MRN: [id number] DOB: 1946-04-19 Today's Date: 11/05/2011  PT Assessment/Plan  PT - Assessment/Plan Comments on Treatment Session: Pt progressing very well today with ambulation. 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  PT Plan: Discharge plan remains appropriate PT Frequency: 7X/week Follow Up Recommendations: Home health PT Equipment Recommended: None recommended by PT PT Goals  Acute Rehab PT Goals PT Goal: Sit to Stand - Progress: Progressing toward goal PT Goal: Stand to Sit - Progress: Progressing toward goal PT Transfer Goal: Bed to Chair/Chair to Bed - Progress: Progressing toward goal PT Goal: Ambulate - Progress: Progressing toward goal PT Goal: Up/Down Stairs - Progress: Not met PT Goal: Perform Home Exercise Program - Progress: Progressing toward goal  PT Treatment Precautions/Restrictions  Precautions Required Braces or Orthoses: No Restrictions Weight Bearing Restrictions: Yes LLE Weight Bearing: Weight bearing as tolerated Mobility (including Balance) Transfers Sit to Stand: From chair/3-in-1;With armrests;With upper extremity assist;Other (comment) (MinGuard A) Sit to Stand Details (indicate cue type and reason): Cues for safe technique Stand to Sit: With armrests;With upper extremity assist;To chair/3-in-1 Stand to Sit Details: Cues to slide out LLE Ambulation/Gait Ambulation/Gait: Yes Ambulation/Gait Assistance: Other (comment) (MinGuard A) Ambulation Distance (Feet): 160 Feet Assistive device: Rolling walker Gait Pattern: Step-through pattern;Decreased stride length    Exercise  Total Joint Exercises Ankle Circles/Pumps: AROM;Left;10 reps Quad Sets: AROM;Left;10 reps Heel Slides: AAROM;Left;10 reps Hip ABduction/ADduction: AROM;Left;10 reps Straight Leg Raises: AAROM;Left;10 reps End of Session PT - End of Session Equipment Utilized During Treatment: Gait belt Patient  left: in chair Nurse Communication: Mobility status for transfers;Mobility status for ambulation General Behavior During Session: [facility] for tasks performed Cognition: [facility] for tasks performed  Kendrick Haapala, Adline Potter 11/05/2011, 1:02 PM 11/05/2011 Fredrich Birks PTA [phone] pager [phone] office

## 2011-11-06 LAB — CBC
MCH: 28.4 pg (ref 26.0–34.0)
MCHC: 31.6 g/dL (ref 30.0–36.0)
Platelets: 184 10*3/uL (ref 150–400)
RDW: 14.5 % (ref 11.5–15.5)

## 2011-11-06 LAB — PROTIME-INR
INR: 1.48 (ref 0.00–1.49)
Prothrombin Time: 18.2 seconds — ABNORMAL HIGH (ref 11.6–15.2)

## 2011-11-06 NOTE — Progress Notes (Signed)
Pt has self administered CPAP.

## 2011-11-06 NOTE — Progress Notes (Signed)
Physical Therapy Treatment Patient Details Name: Kristina Perkins MRN: [id number] DOB: [birth year] Today's Date: 11/06/2011  PT Assessment/Plan  PT - Assessment/Plan Comments on Treatment Session: Pt continuing to make excellent progress with ambulation and stairs PT Plan: Discharge plan remains appropriate PT Frequency: 7X/week Follow Up Recommendations: Home health PT Equipment Recommended: None recommended by PT PT Goals  Acute Rehab PT Goals PT Goal: Sit to Stand - Progress: Met PT Goal: Stand to Sit - Progress: Met PT Goal: Ambulate - Progress: Progressing toward goal PT Goal: Up/Down Stairs - Progress: Met PT Goal: Perform Home Exercise Program - Progress: Progressing toward goal  PT Treatment Precautions/Restrictions  Precautions Required Braces or Orthoses: No Restrictions Weight Bearing Restrictions: Yes LLE Weight Bearing: Weight bearing as tolerated Mobility (including Balance) Bed Mobility Bed Mobility: No Transfers Sit to Stand: 6: Modified independent (Device/Increase time);From chair/3-in-1;With armrests Stand to Sit: 6: Modified independent (Device/Increase time);To chair/3-in-1;With armrests Ambulation/Gait Ambulation/Gait: Yes Ambulation/Gait Assistance: 5: Supervision Ambulation Distance (Feet): 190 Feet Assistive device: Rolling walker Gait Pattern: Step-through pattern;Decreased stride length Stairs: Yes Stairs Assistance: 4: Min assist Stairs Assistance Details (indicate cue type and reason): A for RW. Cues for sequency and technique Stair Management Technique: No rails;With walker Number of Stairs: 2     Exercise  Total Joint Exercises Quad Sets: AROM;Left;15 reps Short Arc Quad: AROM;Left;Other reps (comment) (15) Heel Slides: AAROM;Left;15 reps Hip ABduction/ADduction: AAROM;Left;15 reps Straight Leg Raises: AAROM;Left;15 reps End of Session PT - End of Session Equipment Utilized During Treatment: Gait belt Activity Tolerance: Patient  tolerated treatment well Patient left: in chair;with call bell in reach General Behavior During Session: [facility] for tasks performed Cognition: [facility] for tasks performed  Brylin Stopper, Adline Potter 11/06/2011, 12:00 PM 11/06/2011 Fredrich Birks PTA [phone] pager [phone] office

## 2011-11-06 NOTE — Progress Notes (Signed)
Late Entry:  Patient referred to SW for possible SNF placement. Met with patient- she will return home with husband and friend to help her. States that she has her DME in place and will be followed by [place]. Will notify RNCM- Vance Peper of above. Patient states that she will manage well at home and denies any SW needs.  SW will sign off.  Darylene Price, BSW,11/06/2011 8:42 AM

## 2011-11-06 NOTE — Progress Notes (Signed)
Subjective: 3 Days Post-Op Procedure(s) (LRB): TOTAL KNEE ARTHROPLASTY (Left)  Activity level: oob Diet tolerance:eating Voiding with or without catheter:voiding Patient reports pain as 2 on 0-10 scale.    Objective: Vital signs in last 24 hours: Temp:  [97.8 F (36.6 C)-98 F (36.7 C)] 97.8 F (36.6 C) (12/14 0529) Pulse Rate:  [93-99] 93  (12/14 0529) Resp:  [18] 18  (12/14 0529) BP: (137-143)/(59-77) 143/68 mmHg (12/14 0529) SpO2:  [92 %-96 %] 96 % (12/14 0529)  Intake/Output from previous day: 12/13 0701 - 12/14 0700 In: 720 [P.O.:720] Out: 2 [Urine:2] Intake/Output this shift: Total I/O In: 360 [P.O.:360] Out: -    Basename 11/06/11 0605 11/05/11 0550 11/04/11 0545  HGB 10.4* 10.4* 10.8*    Basename 11/06/11 0605 11/05/11 0550  WBC 7.0 7.5  RBC 3.66* 3.67*  HCT 32.9* 32.9*  PLT 184 173    Basename 11/04/11 0545  NA 140  K 3.5  CL 101  CO2 31  BUN 12  CREATININE 0.68  GLUCOSE 158*  CALCIUM 8.5    Basename 11/06/11 0605 11/05/11 0550  LABPT -- --  INR 1.48 1.46    Neurologically intact ABD soft Neurovascular intact Sensation intact distally Intact pulses distally Dorsiflexion/Plantar flexion intact No cellulitis present Compartment soft  Assessment/Plan: 3 Days Post-Op Procedure(s) (LRB): TOTAL KNEE ARTHROPLASTY (Left) Advance diet Up with therapy Discharge home with home health  Kristina Perkins R 11/06/2011, 8:11 AM

## 2011-12-02 ENCOUNTER — Ambulatory Visit (HOSPITAL_COMMUNITY)
Admission: RE | Admit: 2011-12-02 | Discharge: 2011-12-02 | Disposition: A | Discharge status: Home / Self Care | Payer: 59 | Source: Ambulatory Visit | Attending: Obstetrics & Gynecology | Admitting: Obstetrics & Gynecology

## 2011-12-02 DIAGNOSIS — Z13820 Encounter for screening for osteoporosis: Secondary | ICD-10-CM | POA: Insufficient documentation

## 2011-12-02 DIAGNOSIS — Z1231 Encounter for screening mammogram for malignant neoplasm of breast: Secondary | ICD-10-CM

## 2011-12-02 DIAGNOSIS — Z780 Asymptomatic menopausal state: Secondary | ICD-10-CM

## 2012-07-22 ENCOUNTER — Other Ambulatory Visit: Payer: Self-pay | Admitting: Orthopaedic Surgery

## 2012-08-02 ENCOUNTER — Encounter (HOSPITAL_COMMUNITY): Payer: Self-pay | Admitting: Respiratory Therapy

## 2012-08-04 ENCOUNTER — Encounter (HOSPITAL_COMMUNITY): Payer: Self-pay

## 2012-08-04 ENCOUNTER — Encounter (HOSPITAL_COMMUNITY)
Admission: RE | Admit: 2012-08-04 | Discharge: 2012-08-04 | Disposition: A | Discharge status: Home / Self Care | Payer: 59 | Source: Ambulatory Visit | Attending: Orthopaedic Surgery | Admitting: Orthopaedic Surgery

## 2012-08-04 LAB — CBC WITH DIFFERENTIAL/PLATELET
Basophils Absolute: 0 10*3/uL (ref 0.0–0.1)
Basophils Relative: 0 % (ref 0–1)
Hemoglobin: 13.4 g/dL (ref 12.0–15.0)
MCHC: 33.2 g/dL (ref 30.0–36.0)
Neutro Abs: 2.8 10*3/uL (ref 1.7–7.7)
Neutrophils Relative %: 48 % (ref 43–77)
RDW: 14 % (ref 11.5–15.5)

## 2012-08-04 LAB — URINALYSIS, ROUTINE W REFLEX MICROSCOPIC
Glucose, UA: NEGATIVE mg/dL
Hgb urine dipstick: NEGATIVE
Ketones, ur: NEGATIVE mg/dL
Protein, ur: NEGATIVE mg/dL

## 2012-08-04 LAB — TYPE AND SCREEN: ABO/RH(D): O POS

## 2012-08-04 LAB — URINE MICROSCOPIC-ADD ON

## 2012-08-04 LAB — SURGICAL PCR SCREEN
MRSA, PCR: NEGATIVE
Staphylococcus aureus: POSITIVE — AB

## 2012-08-04 LAB — BASIC METABOLIC PANEL
BUN: 14 mg/dL (ref 6–23)
Creatinine, Ser: 0.67 mg/dL (ref 0.50–1.10)
GFR calc Af Amer: >90 mL/min (ref >90)
GFR calc non Af Amer: 90 mL/min — ABNORMAL LOW (ref >90)
Potassium: 3.9 mEq/L (ref 3.5–5.1)

## 2012-08-04 LAB — PROTIME-INR: INR: 0.99 (ref 0.00–1.49)

## 2012-08-04 LAB — APTT: aPTT: 25 seconds (ref 24–37)

## 2012-08-04 MED ORDER — CHLORHEXIDINE GLUCONATE 4 % EX LIQD: 60.0000 mL | Freq: Once | CUTANEOUS | Status: DC
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
## 2012-08-04 NOTE — Anesthesia Preprocedure Evaluation (Addendum)
Anesthesia Evaluation  Patient identified by MRN, date of birth, ID band Patient awake    Reviewed: Allergy & Precautions, H&P , NPO status , Patient's Chart, lab work & pertinent test results  History of Anesthesia Complications (+) DIFFICULT AIRWAY  Airway Mallampati: III      Dental  (+) Teeth Intact and Dental Advisory Given   Pulmonary shortness of breath and with exertion, asthma , sleep apnea and Continuous Positive Airway Pressure Ventilation ,  breath sounds clear to auscultation        Cardiovascular hypertension, Pt. on medications Rhythm:Regular Rate:Normal     Neuro/Psych    GI/Hepatic   Endo/Other    Renal/GU      Musculoskeletal   Abdominal   Peds  Hematology   Anesthesia Other Findings   Reproductive/Obstetrics                         Anesthesia Physical Anesthesia Plan  ASA: III  Anesthesia Plan: General   Post-op Pain Management:    Induction: Intravenous  Airway Management Planned: Fiberoptic Intubation Planned  Additional Equipment:   Intra-op Plan:   Post-operative Plan:   Informed Consent: I have reviewed the patients History and Physical, chart, labs and discussed the procedure including the risks, benefits and alternatives for the proposed anesthesia with the patient or authorized representative who has indicated his/her understanding and acceptance.   Dental advisory given  Plan Discussed with: CRNA and Surgeon  Anesthesia Plan Comments: (See Anesthesia consult note.  Patient has been previously told her next intubation would need to be an awake intubation.  Shonna Chock, PA-C)      Anesthesia Quick Evaluation

## 2012-08-04 NOTE — Pre-Procedure Instructions (Signed)
20 Kristina Perkins  08/04/2012   Your procedure is scheduled on:  Sept 24, 2013  Report to [facility] at 11:30 AM.  Call this number if you have problems the morning of surgery: [phone]   Remember:   Do not eat food:After Midnight.    Take these medicines the morning of surgery with A SIP OF WATER: xanax as needed   Do not wear jewelry, make-up or nail polish.  Do not wear lotions, powders, or perfumes. You may wear deodorant.  Do not shave 48 hours prior to surgery. Men may shave face and neck.  Do not bring valuables to the hospital.  Contacts, dentures or bridgework may not be worn into surgery.  Leave suitcase in the car. After surgery it may be brought to your room.  For patients admitted to the hospital, checkout time is 11:00 AM the day of discharge.   Patients discharged the day of surgery will not be allowed to drive home.  Name and phone number of your driver:   Special Instructions: Incentive Spirometry - Practice and bring it with you on the day of surgery. and CHG Shower Use Special Wash: 1/2 bottle night before surgery and 1/2 bottle morning of surgery.   Please read over the following fact sheets that you were given: Pain Booklet, Coughing and Deep Breathing, Blood Transfusion Information and Surgical Site Infection Prevention

## 2012-08-12 NOTE — H&P (Signed)
Kristina Perkins is an 66 y.o. female.   Chief Complaint: severe right knee pain HPI: Kristina Perkins is complaining of severe right knee pain limiting her active to these ability to walk comfortably and sleeping at nighttime without pain.  She has been on oral anti-inflammatory medicines most recently Mobic and has had multiple injections of cortisone which were only temporarily helpful.  She has had her opposite knee replaced in 2012 and is done well with that and is wanting the same treatment to the right side now.  Her right knee x-rays reveal bone-on-bone end-stage DJD.  Her symptoms have been ongoing for more than a number of months close to a year.  We have discussed proceeding with a right knee replacement with her.  Past Medical History  Diagnosis Date  . Hypertension   . Asthma     hx of  . Sleep apnea     uses CPAP sleep study done at [facility]  . Cataracts, bilateral   . Pneumonia     no recent history of  . Depression   . Pneumothorax, traumatic 1974  . Traumatic injury of the kidney     38 years ago  . Multiple closed fractures of ribs of left side 1974  . Fracture, sternum closed 1974  . Fracture of left clavicle 1974  . SOB (shortness of breath) on exertion   . Diabetes mellitus     "I'm diabetic when I'm pregnant"  . Melanoma 1990    "on my back"  . Difficult intubation     "I am a terrible intubation; took over 45 min 11/03/11"  . Degenerative joint disease   . Osteoarthritis of knee     "some of my joints"  . DJD (degenerative joint disease)     Past Surgical History  Procedure Date  . Cesarean section 1975  . Shoulder arthroscopy 2006    left  . Adenoidectomy     2nd time  . Tonsillectomy and adenoidectomy     "as a child"  . Cholecystectomy ~ 1999  . Total knee arthroplasty 11/04/11    left  . Total knee arthroplasty 11/03/2011    Procedure: TOTAL KNEE ARTHROPLASTY;  Surgeon: Velna Ochs, MD;  Location: [facility] OR;  Service: Orthopedics;  Laterality: Left;  Primary  left total knee with revision components     Family History  Problem Relation Age of Onset  . Anesthesia problems Mother    Social History:  reports that she has never smoked. She has never used smokeless tobacco. She reports that she drinks alcohol. She reports that she does not use illicit drugs.  Allergies:  Allergies  Allergen Reactions  . Sulfa Antibiotics Other (See Comments)    Blood in urine    Medications: Losartan, Robaxin, Ambien and Mobic  No results found for this or any previous visit (from the past 48 hour(s)). No results found.  Review of Systems  Constitutional: Negative.   HENT: Negative.   Eyes: Negative.   Respiratory: Negative.   Cardiovascular: Negative.   Gastrointestinal: Negative.   Genitourinary: Negative.   Musculoskeletal: Negative.   Skin: Negative.   Neurological: Negative.   Endo/Heme/Allergies: Negative.   Psychiatric/Behavioral: Negative.     There were no vitals taken for this visit. Physical Exam  Constitutional: She is oriented to person, place, and time. She appears well-nourished.  HENT:  Head: Atraumatic.  Eyes: EOM are normal.  Neck: Neck supple.  Cardiovascular: Regular rhythm.   Respiratory: Breath sounds normal.  GI:  Bowel sounds are normal.  Musculoskeletal:       Right knee exam: Range of motion 0-1 15.  She has some crepitation with range of motion and significant pain in the patellofemoral area and medial joint line with palpation.  Good neurovascular status distally and good capillary refill to her toes.  Neurological: She is oriented to person, place, and time.  Skin: Skin is dry.  Psychiatric: She has a normal mood and affect.     Assessment/Plan Assessment: Right knee bone-on-bone DJD Plan: Kristina Perkins would like to go forward with a right knee replacement.  We have reviewed the risk of anesthesia, infection and DVT related to this intervention.  She is a difficult intubation this is to be noted by anesthesia.  We  have discussed the importance of rehabilitation postoperatively for test results.  Kristina Perkins 08/12/2012, 12:04 PM

## 2012-08-15 MED ORDER — CEFAZOLIN SODIUM-DEXTROSE 2-3 GM-% IV SOLR
2.0000 g | INTRAVENOUS | Status: AC
  Administered 2012-08-16: 2 g via INTRAVENOUS
  Filled 2012-08-15: qty 50

## 2012-08-15 MED ORDER — LACTATED RINGERS IV SOLN
INTRAVENOUS | Status: DC
  Administered 2012-08-16: 12:00:00 via INTRAVENOUS

## 2012-08-16 ENCOUNTER — Encounter (HOSPITAL_COMMUNITY): Payer: Self-pay | Admitting: *Deleted

## 2012-08-16 ENCOUNTER — Encounter (HOSPITAL_COMMUNITY): Payer: Self-pay | Admitting: Vascular Surgery

## 2012-08-16 ENCOUNTER — Inpatient Hospital Stay (HOSPITAL_COMMUNITY): Payer: 59 | Admitting: Vascular Surgery

## 2012-08-16 ENCOUNTER — Encounter (HOSPITAL_COMMUNITY)
Admission: RE | Disposition: A | Discharge status: Home Health | Payer: Self-pay | Source: Ambulatory Visit | Attending: Orthopaedic Surgery

## 2012-08-16 ENCOUNTER — Inpatient Hospital Stay (HOSPITAL_COMMUNITY)
Admission: RE | Admit: 2012-08-16 | Discharge: 2012-08-18 | DRG: 470 | Disposition: A | Discharge status: Home Health | Payer: 59 | Source: Ambulatory Visit | Attending: Orthopaedic Surgery | Admitting: Orthopaedic Surgery

## 2012-08-16 DIAGNOSIS — I10 Essential (primary) hypertension: Secondary | ICD-10-CM | POA: Diagnosis present

## 2012-08-16 DIAGNOSIS — M1710 Unilateral primary osteoarthritis, unspecified knee: Principal | ICD-10-CM | POA: Diagnosis present

## 2012-08-16 DIAGNOSIS — Z96659 Presence of unspecified artificial knee joint: Secondary | ICD-10-CM

## 2012-08-16 DIAGNOSIS — M1711 Unilateral primary osteoarthritis, right knee: Secondary | ICD-10-CM | POA: Diagnosis present

## 2012-08-16 HISTORY — PX: TOTAL KNEE ARTHROPLASTY: SHX125

## 2012-08-16 SURGERY — ARTHROPLASTY, KNEE, TOTAL
Anesthesia: General | Site: Knee | Laterality: Right | Wound class: Clean

## 2012-08-16 MED ORDER — SODIUM CHLORIDE 0.9 % IR SOLN
Status: DC | PRN
  Administered 2012-08-16: 3000 mL
  Administered 2012-08-16: 1000 mL

## 2012-08-16 MED ORDER — CEFUROXIME SODIUM 1.5 G IJ SOLR
INTRAMUSCULAR | Status: DC | PRN
  Administered 2012-08-16: 1.5 g

## 2012-08-16 MED ORDER — OXYCODONE HCL 5 MG PO TABS
5.0000 mg | ORAL_TABLET | ORAL | Status: DC | PRN
  Administered 2012-08-16 – 2012-08-18 (×8): 10 mg via ORAL
  Filled 2012-08-16 (×7): qty 2

## 2012-08-16 MED ORDER — ONDANSETRON HCL 4 MG/2ML IJ SOLN
INTRAMUSCULAR | Status: DC | PRN
  Administered 2012-08-16: 4 mg via INTRAVENOUS

## 2012-08-16 MED ORDER — METHOCARBAMOL 100 MG/ML IJ SOLN
500.0000 mg | Freq: Four times a day (QID) | INTRAVENOUS | Status: DC | PRN
  Administered 2012-08-16: 500 mg via INTRAVENOUS
  Filled 2012-08-16: qty 5

## 2012-08-16 MED ORDER — ACETAMINOPHEN 650 MG RE SUPP: 650.0000 mg | Freq: Four times a day (QID) | RECTAL | Status: DC | PRN

## 2012-08-16 MED ORDER — METHOCARBAMOL 500 MG PO TABS
500.0000 mg | ORAL_TABLET | Freq: Four times a day (QID) | ORAL | Status: DC | PRN
  Administered 2012-08-17 (×2): 500 mg via ORAL
  Filled 2012-08-16 (×3): qty 1

## 2012-08-16 MED ORDER — HYDROMORPHONE HCL PF 1 MG/ML IJ SOLN
INTRAMUSCULAR | Status: AC
  Administered 2012-08-17: 1 mg via INTRAVENOUS
  Filled 2012-08-16: qty 1

## 2012-08-16 MED ORDER — PROPOFOL 10 MG/ML IV BOLUS
INTRAVENOUS | Status: DC | PRN
  Administered 2012-08-16: 150 mg via INTRAVENOUS

## 2012-08-16 MED ORDER — CEFUROXIME SODIUM 1.5 G IJ SOLR
INTRAMUSCULAR | Status: AC
  Filled 2012-08-16: qty 1.5

## 2012-08-16 MED ORDER — PHENYLEPHRINE HCL 10 MG/ML IJ SOLN
INTRAMUSCULAR | Status: DC | PRN
  Administered 2012-08-16 (×2): 80 ug via INTRAVENOUS

## 2012-08-16 MED ORDER — HYDROMORPHONE HCL PF 1 MG/ML IJ SOLN
0.2500 mg | INTRAMUSCULAR | Status: DC | PRN
  Administered 2012-08-16 (×2): 0.5 mg via INTRAVENOUS

## 2012-08-16 MED ORDER — FENTANYL CITRATE 0.05 MG/ML IJ SOLN
INTRAMUSCULAR | Status: DC | PRN
  Administered 2012-08-16 (×2): 25 ug via INTRAVENOUS
  Administered 2012-08-16: 50 ug via INTRAVENOUS
  Administered 2012-08-16: 25 ug via INTRAVENOUS
  Administered 2012-08-16 (×2): 50 ug via INTRAVENOUS
  Administered 2012-08-16: 25 ug via INTRAVENOUS

## 2012-08-16 MED ORDER — ZOLPIDEM TARTRATE 5 MG PO TABS
5.0000 mg | ORAL_TABLET | Freq: Every evening | ORAL | Status: DC | PRN
  Administered 2012-08-16: 5 mg via ORAL
  Filled 2012-08-16: qty 1

## 2012-08-16 MED ORDER — METOCLOPRAMIDE HCL 5 MG PO TABS
5.0000 mg | ORAL_TABLET | Freq: Three times a day (TID) | ORAL | Status: DC | PRN
  Filled 2012-08-16: qty 2

## 2012-08-16 MED ORDER — SENNOSIDES-DOCUSATE SODIUM 8.6-50 MG PO TABS: 1.0000 | ORAL_TABLET | Freq: Every evening | ORAL | Status: DC | PRN

## 2012-08-16 MED ORDER — LOSARTAN POTASSIUM-HCTZ 50-12.5 MG PO TABS: 1.0000 | ORAL_TABLET | Freq: Every day | ORAL | Status: DC

## 2012-08-16 MED ORDER — COCAINE HCL 4 % EX SOLN: 4.0000 mL | Freq: Once | CUTANEOUS | Status: DC

## 2012-08-16 MED ORDER — FENTANYL CITRATE 0.05 MG/ML IJ SOLN
INTRAMUSCULAR | Status: AC
  Filled 2012-08-16: qty 2

## 2012-08-16 MED ORDER — ALPRAZOLAM 0.25 MG PO TABS
0.2500 mg | ORAL_TABLET | Freq: Three times a day (TID) | ORAL | Status: DC | PRN
  Administered 2012-08-17: 0.25 mg via ORAL
  Filled 2012-08-16: qty 1

## 2012-08-16 MED ORDER — ASPIRIN EC 325 MG PO TBEC
325.0000 mg | DELAYED_RELEASE_TABLET | Freq: Two times a day (BID) | ORAL | Status: DC
  Administered 2012-08-17 – 2012-08-18 (×3): 325 mg via ORAL
  Filled 2012-08-16 (×5): qty 1

## 2012-08-16 MED ORDER — MENTHOL 3 MG MT LOZG: 1.0000 | LOZENGE | OROMUCOSAL | Status: DC | PRN

## 2012-08-16 MED ORDER — LACTATED RINGERS IV SOLN
INTRAVENOUS | Status: DC | PRN
  Administered 2012-08-16 (×3): via INTRAVENOUS

## 2012-08-16 MED ORDER — FLEET ENEMA 7-19 GM/118ML RE ENEM: 1.0000 | ENEMA | Freq: Once | RECTAL | Status: AC | PRN

## 2012-08-16 MED ORDER — ONDANSETRON HCL 4 MG/2ML IJ SOLN: 4.0000 mg | Freq: Four times a day (QID) | INTRAMUSCULAR | Status: DC | PRN

## 2012-08-16 MED ORDER — HYDROMORPHONE HCL PF 1 MG/ML IJ SOLN
0.5000 mg | INTRAMUSCULAR | Status: DC | PRN
  Administered 2012-08-17 (×2): 1 mg via INTRAVENOUS
  Filled 2012-08-16 (×3): qty 1

## 2012-08-16 MED ORDER — ONDANSETRON HCL 4 MG/2ML IJ SOLN: 4.0000 mg | Freq: Once | INTRAMUSCULAR | Status: DC | PRN

## 2012-08-16 MED ORDER — MIDAZOLAM HCL 5 MG/5ML IJ SOLN
INTRAMUSCULAR | Status: DC | PRN
  Administered 2012-08-16 (×2): 1 mg via INTRAVENOUS

## 2012-08-16 MED ORDER — LOSARTAN POTASSIUM 50 MG PO TABS
50.0000 mg | ORAL_TABLET | Freq: Every day | ORAL | Status: DC
  Administered 2012-08-16 – 2012-08-18 (×3): 50 mg via ORAL
  Filled 2012-08-16 (×3): qty 1

## 2012-08-16 MED ORDER — FENTANYL CITRATE 0.05 MG/ML IJ SOLN: 100.0000 ug | Freq: Once | INTRAMUSCULAR | Status: DC

## 2012-08-16 MED ORDER — DIPHENHYDRAMINE HCL 12.5 MG/5ML PO ELIX: 12.5000 mg | ORAL_SOLUTION | ORAL | Status: DC | PRN

## 2012-08-16 MED ORDER — MIDAZOLAM HCL 2 MG/2ML IJ SOLN
INTRAMUSCULAR | Status: AC
  Filled 2012-08-16: qty 2

## 2012-08-16 MED ORDER — SODIUM CHLORIDE 0.9 % IV SOLN
200.0000 ug | INTRAVENOUS | Status: DC | PRN
  Administered 2012-08-16: 12 ug via INTRAVENOUS
  Administered 2012-08-16: 12 ug/kg/h via INTRAVENOUS

## 2012-08-16 MED ORDER — LACTATED RINGERS IV SOLN: INTRAVENOUS | Status: DC

## 2012-08-16 MED ORDER — MIDAZOLAM HCL 2 MG/2ML IJ SOLN: 2.0000 mg | Freq: Once | INTRAMUSCULAR | Status: DC

## 2012-08-16 MED ORDER — DOCUSATE SODIUM 100 MG PO CAPS
100.0000 mg | ORAL_CAPSULE | Freq: Two times a day (BID) | ORAL | Status: DC
  Administered 2012-08-16 – 2012-08-18 (×4): 100 mg via ORAL
  Filled 2012-08-16 (×6): qty 1

## 2012-08-16 MED ORDER — ACETAMINOPHEN 325 MG PO TABS: 650.0000 mg | ORAL_TABLET | Freq: Four times a day (QID) | ORAL | Status: DC | PRN

## 2012-08-16 MED ORDER — HYDROCHLOROTHIAZIDE 12.5 MG PO CAPS
12.5000 mg | ORAL_CAPSULE | Freq: Every day | ORAL | Status: DC
  Administered 2012-08-16 – 2012-08-18 (×3): 12.5 mg via ORAL
  Filled 2012-08-16 (×3): qty 1

## 2012-08-16 MED ORDER — ACETAMINOPHEN 10 MG/ML IV SOLN
INTRAVENOUS | Status: AC
  Filled 2012-08-16: qty 100

## 2012-08-16 MED ORDER — LIDOCAINE HCL (CARDIAC) 20 MG/ML IV SOLN
INTRAVENOUS | Status: DC | PRN
  Administered 2012-08-16: 30 mg via INTRAVENOUS

## 2012-08-16 MED ORDER — METOCLOPRAMIDE HCL 5 MG/ML IJ SOLN: 5.0000 mg | Freq: Three times a day (TID) | INTRAMUSCULAR | Status: DC | PRN

## 2012-08-16 MED ORDER — ALUM & MAG HYDROXIDE-SIMETH 200-200-20 MG/5ML PO SUSP: 30.0000 mL | ORAL | Status: DC | PRN

## 2012-08-16 MED ORDER — OXYCODONE HCL 5 MG PO TABS
ORAL_TABLET | ORAL | Status: AC
  Filled 2012-08-16: qty 2

## 2012-08-16 MED ORDER — CEFAZOLIN SODIUM-DEXTROSE 2-3 GM-% IV SOLR
2.0000 g | Freq: Four times a day (QID) | INTRAVENOUS | Status: AC
  Administered 2012-08-16 – 2012-08-17 (×2): 2 g via INTRAVENOUS
  Filled 2012-08-16 (×3): qty 50

## 2012-08-16 MED ORDER — PHENOL 1.4 % MT LIQD
1.0000 | OROMUCOSAL | Status: DC | PRN
  Filled 2012-08-16: qty 177

## 2012-08-16 MED ORDER — ONDANSETRON HCL 4 MG PO TABS: 4.0000 mg | ORAL_TABLET | Freq: Four times a day (QID) | ORAL | Status: DC | PRN

## 2012-08-16 SURGICAL SUPPLY — 65 items
APL SKNCLS STERI-STRIP NONHPOA (GAUZE/BANDAGES/DRESSINGS) ×1
BANDAGE ELASTIC 4 VELCRO ST LF (GAUZE/BANDAGES/DRESSINGS) ×2 IMPLANT
BANDAGE ELASTIC 6 VELCRO ST LF (GAUZE/BANDAGES/DRESSINGS) ×1 IMPLANT
BANDAGE ESMARK 6X9 LF (GAUZE/BANDAGES/DRESSINGS) ×1 IMPLANT
BANDAGE GAUZE ELAST BULKY 4 IN (GAUZE/BANDAGES/DRESSINGS) ×3 IMPLANT
BENZOIN TINCTURE PRP APPL 2/3 (GAUZE/BANDAGES/DRESSINGS) ×1 IMPLANT
BLADE SAGITTAL 25.0X1.19X90 (BLADE) ×2 IMPLANT
BLADE SURG ROTATE 9660 (MISCELLANEOUS) IMPLANT
BNDG CMPR 9X6 STRL LF SNTH (GAUZE/BANDAGES/DRESSINGS) ×1
BNDG CMPR MED 10X6 ELC LF (GAUZE/BANDAGES/DRESSINGS) ×1
BNDG ELASTIC 6X10 VLCR STRL LF (GAUZE/BANDAGES/DRESSINGS) ×2 IMPLANT
BNDG ESMARK 6X9 LF (GAUZE/BANDAGES/DRESSINGS) ×2
BOWL SMART MIX CTS (DISPOSABLE) ×2 IMPLANT
CEMENT HV SMART SET (Cement) ×4 IMPLANT
CLOTH BEACON ORANGE TIMEOUT ST (SAFETY) ×2 IMPLANT
COVER SURGICAL LIGHT HANDLE (MISCELLANEOUS) ×2 IMPLANT
CUFF TOURNIQUET SINGLE 34IN LL (TOURNIQUET CUFF) ×2 IMPLANT
CUFF TOURNIQUET SINGLE 44IN (TOURNIQUET CUFF) IMPLANT
DRAPE EXTREMITY T 121X128X90 (DRAPE) ×2 IMPLANT
DRAPE PROXIMA HALF (DRAPES) ×2 IMPLANT
DRAPE U-SHAPE 47X51 STRL (DRAPES) ×2 IMPLANT
DRSG ADAPTIC 3X8 NADH LF (GAUZE/BANDAGES/DRESSINGS) ×2 IMPLANT
DRSG PAD ABDOMINAL 8X10 ST (GAUZE/BANDAGES/DRESSINGS) ×2 IMPLANT
DURAPREP 26ML APPLICATOR (WOUND CARE) ×2 IMPLANT
ELECT REM PT RETURN 9FT ADLT (ELECTROSURGICAL) ×2
ELECTRODE REM PT RTRN 9FT ADLT (ELECTROSURGICAL) ×1 IMPLANT
EVACUATOR 1/8 PVC DRAIN (DRAIN) IMPLANT
FACESHIELD LNG OPTICON STERILE (SAFETY) ×3 IMPLANT
GLOVE BIO SURGEON STRL SZ8.5 (GLOVE) ×2 IMPLANT
GLOVE BIOGEL PI IND STRL 8 (GLOVE) ×1 IMPLANT
GLOVE BIOGEL PI IND STRL 8.5 (GLOVE) ×1 IMPLANT
GLOVE BIOGEL PI INDICATOR 8 (GLOVE) ×1
GLOVE BIOGEL PI INDICATOR 8.5 (GLOVE) ×1
GLOVE SS BIOGEL STRL SZ 8 (GLOVE) ×1 IMPLANT
GLOVE SUPERSENSE BIOGEL SZ 8 (GLOVE) ×1
GOWN PREVENTION PLUS XLARGE (GOWN DISPOSABLE) ×2 IMPLANT
GOWN PREVENTION PLUS XXLARGE (GOWN DISPOSABLE) ×2 IMPLANT
GOWN STRL NON-REIN LRG LVL3 (GOWN DISPOSABLE) ×2 IMPLANT
HANDPIECE INTERPULSE COAX TIP (DISPOSABLE) ×2
HOOD PEEL AWAY FACE SHEILD DIS (HOOD) ×2 IMPLANT
IMMOBILIZER KNEE 20 (SOFTGOODS)
IMMOBILIZER KNEE 20 THIGH 36 (SOFTGOODS) IMPLANT
IMMOBILIZER KNEE 22 UNIV (SOFTGOODS) ×2 IMPLANT
IMMOBILIZER KNEE 24 THIGH 36 (MISCELLANEOUS) IMPLANT
IMMOBILIZER KNEE 24 UNIV (MISCELLANEOUS)
KIT BASIN OR (CUSTOM PROCEDURE TRAY) ×2 IMPLANT
KIT ROOM TURNOVER OR (KITS) ×2 IMPLANT
MANIFOLD NEPTUNE II (INSTRUMENTS) ×2 IMPLANT
NS IRRIG 1000ML POUR BTL (IV SOLUTION) ×2 IMPLANT
PACK TOTAL JOINT (CUSTOM PROCEDURE TRAY) ×2 IMPLANT
PAD ARMBOARD 7.5X6 YLW CONV (MISCELLANEOUS) ×4 IMPLANT
SET HNDPC FAN SPRY TIP SCT (DISPOSABLE) ×1 IMPLANT
SPONGE GAUZE 4X4 12PLY (GAUZE/BANDAGES/DRESSINGS) ×2 IMPLANT
STAPLER VISISTAT 35W (STAPLE) ×2 IMPLANT
STRIP CLOSURE SKIN 1/2X4 (GAUZE/BANDAGES/DRESSINGS) ×1 IMPLANT
SUCTION FRAZIER TIP 10 FR DISP (SUCTIONS) IMPLANT
SUT VIC AB 0 CT1 27 (SUTURE) ×4
SUT VIC AB 0 CT1 27XBRD ANBCTR (SUTURE) ×2 IMPLANT
SUT VIC AB 2-0 CT1 27 (SUTURE) ×4
SUT VIC AB 2-0 CT1 TAPERPNT 27 (SUTURE) ×2 IMPLANT
SUT VLOC 180 0 24IN GS25 (SUTURE) ×2 IMPLANT
TOWEL OR 17X24 6PK STRL BLUE (TOWEL DISPOSABLE) ×2 IMPLANT
TOWEL OR 17X26 10 PK STRL BLUE (TOWEL DISPOSABLE) ×2 IMPLANT
TRAY FOLEY CATH 14FR (SET/KITS/TRAYS/PACK) ×2 IMPLANT
WATER STERILE IRR 1000ML POUR (IV SOLUTION) ×4 IMPLANT

## 2012-08-16 NOTE — Progress Notes (Signed)
Orthopedic Tech Progress Note Patient Details:  Kristina Perkins [birth year] [id number]  CPM Right Knee CPM Right Knee: On Right Knee Flexion (Degrees): 60  Right Knee Extension (Degrees): 0  Additional Comments: no ohf available; one will be provided when ortho gets one   [name] 08/16/2012, 8:15 PM

## 2012-08-16 NOTE — Anesthesia Procedure Notes (Addendum)
Anesthesia Regional Block:  Femoral nerve block  Pre-Anesthetic Checklist: ,, timeout performed, Correct Patient, Correct Site, Correct Laterality, Correct Procedure, Correct Position, site marked, Risks and benefits discussed,  Surgical consent,  Pre-op evaluation,  At surgeon's request and post-op pain management  Laterality: Right  Prep: chloraprep       Needles:   Needle Type: Echogenic Needle      Needle Gauge: 22 and 22 G    Additional Needles:  Procedures: ultrasound guided and nerve stimulator Femoral nerve block Narrative:  Start time: 08/16/2012 12:30 PM End time: 08/16/2012 12:40 PM  Performed by: Personally   Additional Notes: 25 cc 0.5% marcaine with 1:200 Epi injected easily.  Kipp Brood, MD   Procedure Name: Awake intubation Date/Time: 08/16/2012 2:03 PM Performed by: Quentin Ore Pre-anesthesia Checklist: Patient identified, Emergency Drugs available, Suction available, Patient being monitored and Timeout performed Patient Re-evaluated:Patient Re-evaluated prior to inductionOxygen Delivery Method: Circle system utilized Preoxygenation: Pre-oxygenation with 100% oxygen Intubation Type: IV induction Nasal Tubes: Right Tube size: 7.0 mm Number of attempts: 1 Airway Equipment and Method: Fiberoptic brochoscope Placement Confirmation: ETT inserted through vocal cords under direct vision,  positive ETCO2 and breath sounds checked- equal and bilateral Secured at: 28 cm Tube secured with: Tape Dental Injury: Teeth and Oropharynx as per pre-operative assessment  Future Recommendations: Recommend- awake intubation Comments: Awake intubation per Dr. Noreene Larsson. Please see his note.

## 2012-08-16 NOTE — Transfer of Care (Signed)
Immediate Anesthesia Transfer of Care Note  Patient: Kristina Perkins  Procedure(s) Performed: Procedure(s) (LRB) with comments: TOTAL KNEE ARTHROPLASTY (Right) - Difficult Airway Awake Intubation per Anesthesia  Patient Location: PACU  Anesthesia Type: General  Level of Consciousness: awake, alert , oriented, sedated and patient cooperative  Airway & Oxygen Therapy: Patient Spontanous Breathing and Patient connected to face mask oxygen  Post-op Assessment: Report given to PACU RN, Post -op Vital signs reviewed and stable and Patient moving all extremities  Post vital signs: Reviewed and stable  Complications: No apparent anesthesia complications

## 2012-08-16 NOTE — Progress Notes (Signed)
Report received from [facility].

## 2012-08-16 NOTE — Op Note (Signed)
PREOP DIAGNOSIS: DJD RIGHT KNEE POSTOP DIAGNOSIS: DJD RIGHT KNEE PROCEDURE: RIGHT TKR ANESTHESIA: General and block ATTENDING SURGEON: Marquies Wanat G ASSISTANT: Kristina Qua PA  INDICATIONS FOR PROCEDURE: Kristina Perkins is a 66 y.o. female who has struggled for a long time with pain due to degenerative arthritis of the right knee.  The patient has failed many conservative non-operative measures and at this point has pain which limits the ability to sleep and walk.  The patient is offered total knee replacement.  Informed operative consent was obtained after discussion of possible risks of anesthesia, infection, neurovascular injury, DVT, and death.  The importance of the post-operative rehabilitation protocol to optimize result was stressed extensively with the patient.  SUMMARY OF FINDINGS AND PROCEDURE:  Kristina Perkins was taken to the operative suite where under the above anesthesia a right knee replacement was performed.  There were advanced degenerative changes and the bone quality was excellent.  We used the DePuy system and placed size standard plus femur, tibia, 38 mm all polyethylene patella, and a size 10 spacer.  I did include Zinacef antibiotic in the cement.  The patient was admitted for appropriate post-op care to include perioperative antibiotics and mechanical and pharmacologic measures for DVT prophylaxis.  DESCRIPTION OF PROCEDURE:  Kristina Perkins was taken to the operative suite where the above anesthesia was applied.  The patient was positioned supine and prepped and draped in normal sterile fashion.  An appropriate time out was performed.  After the administration of Kefzol pre-op antibiotic a standard longitudinal incision was made on the anterior knee.  Dissection was carried down to the extensor mechanism.  All appropriate anti-infective measures were used including the pre-operative antibiotic, betadine impregnated drape, and closed hooded exhaust systems for each  member of the surgical team.  A medial parapatellar incision was made in the extensor mechanism and the knee cap flipped and the knee flexed.  Some residual meniscal tissues were removed along with any remaining ACL/PCL tissue.  A guide was placed on the tibia and a flat cut was made on it's superior surface.  An intramedullary guide was placed in the femur and was utilized to make anterior and posterior cuts creating an appropriate flexion gap.  A second intramedullary guide was placed in the femur to make a distal cut properly balancing the knee with an extension gap equal to the flexion gap.  The three bones sized to the above mentioned sizes and the appropriate guides were placed and utilized.  A trial reduction was done and the knee easily came to full extension and the patella tracked well on flexion.  The trial components were removed and all bones were cleaned with pulsatile lavage and then dried thoroughly.  Cement was mixed including antibiotic and was pressurized onto the bones followed by placement of the aforementioned components.  Excess cement was trimmed and pressure was held on the components until the cement had hardened.  The tourniquet was deflated and a small amount of bleeding was controlled with cautery and pressure.  The knee was irrigated thoroughly.  The extensor mechanism was re-approximated with V-loc suture in running fashion.  The knee was flexed and the repair was solid.  The subcutaneous tissues were re-approximated with #0 and #2-0 vicryl and the skin closed with staples.  A sterile dressing was applied.  Intraoperative fluids, EBL, and tourniquet time can be obtained from anesthesia records.  DISPOSITION:  The patient was taken to recovery room in stable condition and admitted  for appropriate post-op care to include peri-operative antibiotic and DVT prophylaxis with mechanical and pharmacologic measures.  Atha Mcbain G 08/16/2012, 4:01 PM

## 2012-08-16 NOTE — Interval H&P Note (Signed)
History and Physical Interval Note:  08/16/2012 12:46 PM  Kristina Perkins  has presented today for surgery, with the diagnosis of DEGENERATIVE JOINT DISEASE RIGHT KNEE  The various methods of treatment have been discussed with the patient and family. After consideration of risks, benefits and other options for treatment, the patient has consented to  Procedure(s) (LRB) with comments: TOTAL KNEE ARTHROPLASTY (Right) - Difficult Airway Awake Intubation per Anesthesia as a surgical intervention .  The patient's history has been reviewed, patient examined, no change in status, stable for surgery.  I have reviewed the patient's chart and labs.  Questions were answered to the patient's satisfaction.     Aleza Pew G

## 2012-08-16 NOTE — Anesthesia Postprocedure Evaluation (Signed)
  Anesthesia Post-op Note  Patient: Kristina Perkins  Procedure(s) Performed: Procedure(s) (LRB) with comments: TOTAL KNEE ARTHROPLASTY (Right) - Difficult Airway Awake Intubation per Anesthesia  Patient Location: PACU  Anesthesia Type: General and GA combined with regional for post-op pain  Level of Consciousness: awake, alert  and oriented  Airway and Oxygen Therapy: Patient Spontanous Breathing and Patient connected to nasal cannula oxygen  Post-op Pain: none  Post-op Assessment: Post-op Vital signs reviewed and Patient's Cardiovascular Status Stable  Post-op Vital Signs: stable  Complications: No apparent anesthesia complications

## 2012-08-17 ENCOUNTER — Encounter (HOSPITAL_COMMUNITY): Payer: Self-pay | Admitting: *Deleted

## 2012-08-17 LAB — BASIC METABOLIC PANEL
CO2: 25 mEq/L (ref 19–32)
Calcium: 8.7 mg/dL (ref 8.4–10.5)
Chloride: 100 mEq/L (ref 96–112)
Creatinine, Ser: 0.62 mg/dL (ref 0.50–1.10)
Glucose, Bld: 139 mg/dL — ABNORMAL HIGH (ref 70–99)

## 2012-08-17 LAB — CBC
HCT: 37.2 % (ref 36.0–46.0)
Hemoglobin: 11.8 g/dL — ABNORMAL LOW (ref 12.0–15.0)
MCH: 28.2 pg (ref 26.0–34.0)
MCV: 88.8 fL (ref 78.0–100.0)
RBC: 4.19 MIL/uL (ref 3.87–5.11)
WBC: 8.7 10*3/uL (ref 4.0–10.5)

## 2012-08-17 NOTE — Progress Notes (Signed)
Physical Therapy Treatment Patient Details Name: Kristina Perkins MRN: [id number] DOB: 1946-06-29 Today's Date: 08/17/2012 Time: 4098-1191 PT Time Calculation (min): 24 min  PT Assessment / Plan / Recommendation Comments on Treatment Session  Patient educated on quad setting to prevent buckling as experienced in prior session.  Patient performed multiple sit to stand transfers with some VC's required.  Patient able to increase ambulation distance this pm with some fatigue and increased pain.  Patient educated on expectations for CPM protocol this evening.  Will follow up with stair negotiation training in the am.  Will continue to prorgess activity as tolerated.    Follow Up Recommendations  Home health PT    Barriers to Discharge        Equipment Recommendations  Rolling walker with 5" wheels    Recommendations for Other Services    Frequency 7X/week   Plan Discharge plan remains appropriate    Precautions / Restrictions Precautions Precautions: Knee Required Braces or Orthoses: Knee Immobilizer - Right Restrictions Weight Bearing Restrictions: Yes RLE Weight Bearing: Weight bearing as tolerated   Pertinent Vitals/Pain 7/10    Mobility  Bed Mobility Bed Mobility: Not assessed Transfers Transfers: Sit to Stand;Stand to Sit Sit to Stand: 4: Min guard;From chair/3-in-1;With armrests Stand to Sit: 4: Min guard;With armrests;To chair/3-in-1 Details for Transfer Assistance: VC's for hand placement from commode to stand, and VC's for controlled stand to sit.  Patient performed transfer 3x Ambulation/Gait Ambulation/Gait Assistance: 4: Min guard Ambulation Distance (Feet): 60 Feet Assistive device: Rolling walker Ambulation/Gait Assistance Details: Min Guard secondary to increased pain  Gait Pattern: Step-to pattern;Decreased stance time - right;Decreased stride length;Shuffle;Antalgic;Trunk flexed Gait velocity: decreased General Gait Details: Patient more steady this session, no  apparent buckling of RLE Stairs: No      PT Goals Acute Rehab PT Goals Pt will Stand: with modified independence PT Goal: Stand - Progress: Progressing toward goal Pt will Ambulate: >150 feet;with modified independence;with rolling walker PT Goal: Ambulate - Progress: Progressing toward goal  Visit Information  Last PT Received On: 08/17/12 Assistance Needed: +1    Subjective Data  Subjective: Want's to bathe once she returns Patient Stated Goal: to go home   Cognition  Overall Cognitive Status: Appears within functional limits for tasks assessed/performed Arousal/Alertness: Awake/alert Orientation Level: Appears intact for tasks assessed;Oriented X4 / Intact Behavior During Session: [facility] for tasks performed    Balance     End of Session PT - End of Session Equipment Utilized During Treatment: Gait belt;Right knee immobilizer Activity Tolerance: Patient tolerated treatment well;Patient limited by fatigue;Patient limited by pain Patient left: in chair;with call bell/phone within reach;with family/visitor present Nurse Communication: Mobility status;Patient requests pain meds (Patient would like to bathe)   GP     [name] 08/17/2012, 3:50 PM [name], PT DPT  [phone]

## 2012-08-17 NOTE — Progress Notes (Signed)
Utilization review completed. Jalesha Plotz, RN, BSN. 

## 2012-08-17 NOTE — Progress Notes (Signed)
Subjective: 1 Day Post-Op Procedure(s) (LRB): TOTAL KNEE ARTHROPLASTY (Right)  Activity level:  oob today Diet tolerance:  eating Voiding:  Foley out today Patient reports pain as 3 on 0-10 scale.    Objective: Vital signs in last 24 hours: Temp:  [97.5 F (36.4 C)-98.1 F (36.7 C)] 97.6 F (36.4 C) (09/25 0618) Pulse Rate:  [61-89] 89  (09/25 0618) Resp:  [13-20] 18  (09/25 0618) BP: (94-153)/(36-84) 126/36 mmHg (09/25 0618) SpO2:  [90 %-100 %] 90 % (09/25 0618)  Labs:  [facility] 08/17/12 0452  HGB 11.8*    Basename 08/17/12 0452  WBC 8.7  RBC 4.19  HCT 37.2  PLT 187    Basename 08/17/12 0452  NA 138  K 4.3  CL 100  CO2 25  BUN 14  CREATININE 0.62  GLUCOSE 139*  CALCIUM 8.7   No results found for this basename: LABPT:2,INR:2 in the last 72 hours  Physical Exam:  Neurologically intact ABD soft Neurovascular intact Sensation intact distally Intact pulses distally Dorsiflexion/Plantar flexion intact No cellulitis present Compartment soft  Assessment/Plan:  1 Day Post-Op Procedure(s) (LRB): TOTAL KNEE ARTHROPLASTY (Right) Advance diet Up with therapy D/C IV fluids Plan for discharge tomorrow with HH-Pt    Jehieli Brassell R 08/17/2012, 8:05 AM

## 2012-08-17 NOTE — Evaluation (Signed)
Physical Therapy Evaluation Patient Details Name: Kristina Perkins MRN: [id number] DOB: [birth year] Today's Date: 08/17/2012 Time: 4098-1191 PT Time Calculation (min): 27 min  PT Assessment / Plan / Recommendation Clinical Impression  Pt is a pleasent and cooperative 66 y.o. female s/p right TKA.  Patient had previously Left TKA and is familiar with therapy procedures. Patient presents with deficits in functional mobility secondary to pain, limited ROM, weakness, and decreased activity tolerance.  Pt will benefit from continued skilled PT to address deficits and maximize independence for discharge. Rec Home PT.    PT Assessment  Patient needs continued PT services    Follow Up Recommendations  Home health PT    Barriers to Discharge        Equipment Recommendations  Rolling walker with 5" wheels (Patient may need rw pending type of current walker owned )    Recommendations for Other Services     Frequency 7X/week    Precautions / Restrictions Precautions Precautions: Knee Required Braces or Orthoses: Knee Immobilizer - Right Restrictions Weight Bearing Restrictions: Yes RLE Weight Bearing: Weight bearing as tolerated   Pertinent Vitals/Pain 4/10      Mobility  Bed Mobility Bed Mobility: Supine to Sit;Sitting - Scoot to Edge of Bed Supine to Sit: 5: Supervision Sitting - Scoot to [place] of Bed: 4: Min assist Details for Bed Mobility Assistance: Min Assist for RLE  Transfers Transfers: Sit to Stand;Stand to Sit Sit to Stand: 4: Min guard;From bed;From chair/3-in-1 Stand to Sit: 4: Min guard;To chair/3-in-1;With armrests Details for Transfer Assistance: VCs for hand placement, patient performed multiple transfers to/from commode and chair Ambulation/Gait Ambulation/Gait Assistance: 4: Min guard;4: Min assist Ambulation Distance (Feet): 8 Feet Assistive device: Rolling walker Ambulation/Gait Assistance Details: Patient required Min G and occassional Min A secondary to RLE  buckling Gait Pattern: Step-to pattern;Decreased stance time - right;Decreased stride length;Shuffle;Antalgic;Trunk flexed Gait velocity: decreased General Gait Details: Patient unsteady for ambulation of any distance at this time secondary to wekaness and fatigue causing RLE buckling despite immobilizer Stairs: No    Shoulder Instructions     Exercises     PT Diagnosis: Difficulty walking;Abnormality of gait;Generalized weakness;Acute pain  PT Problem List: Decreased strength;Decreased range of motion;Decreased activity tolerance;Decreased balance;Decreased mobility;Pain PT Treatment Interventions: DME instruction;Gait training;Stair training;Functional mobility training;Therapeutic activities;Therapeutic exercise;Patient/family education   PT Goals Acute Rehab PT Goals PT Goal Formulation: With patient Time For Goal Achievement: 08/22/12 Potential to Achieve Goals: Good Pt will Stand: with modified independence PT Goal: Stand - Progress: Goal set today Pt will Ambulate: >150 feet;with modified independence;with rolling walker PT Goal: Ambulate - Progress: Goal set today Pt will Go Up / Down Stairs: 3-5 stairs;with min assist;with rolling walker PT Goal: Up/Down Stairs - Progress: Goal set today Pt will Perform Home Exercise Program: Independently PT Goal: Perform Home Exercise Program - Progress: Goal set today  Visit Information  Last PT Received On: 08/17/12 Assistance Needed: +1    Subjective Data  Subjective: I would like to go to the bathroom Patient Stated Goal: to go home   Prior Functioning  Home Living Lives With: Spouse;Family Available Help at Discharge: Family;Available 24 hours/day Type of Home: House Home Access: Stairs to enter [employer] of Steps: 2 Entrance Stairs-Rails: None Home Layout: One level Bathroom Shower/Tub: Health visitor: Handicapped height Home Adaptive Equipment: Environmental consultant - standard;Straight cane Prior  Function Level of Independence: Independent Able to Take Stairs?: Yes Driving: Yes Vocation: Full time employment 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  Communication Communication: No difficulties Dominant Hand: Right    Cognition  Overall Cognitive Status: Appears within functional limits for tasks assessed/performed Arousal/Alertness: Awake/alert Orientation Level: Appears intact for tasks assessed;Oriented X4 / Intact Behavior During Session: [facility] for tasks performed    Extremity/Trunk Assessment Right Upper Extremity Assessment RUE ROM/Strength/Tone: [facility] for tasks assessed Left Upper Extremity Assessment LUE ROM/Strength/Tone: [facility] for tasks assessed Right Lower Extremity Assessment RLE ROM/Strength/Tone: Deficits;Unable to fully assess;Due to pain;Due to precautions Left Lower Extremity Assessment LLE ROM/Strength/Tone: [facility] for tasks assessed   Balance    End of Session PT - End of Session Equipment Utilized During Treatment: Gait belt;Right knee immobilizer Activity Tolerance: Patient tolerated treatment well;Patient limited by fatigue;Patient limited by pain Patient left: in chair;with call bell/phone within reach Nurse Communication: Mobility status;Patient requests pain meds CPM Right Knee CPM Right Knee: Off  GP     [name] 08/17/2012, 11:39 AM [name], PT DPT  [phone]

## 2012-08-18 LAB — CBC
MCH: 28.8 pg (ref 26.0–34.0)
MCHC: 33.1 g/dL (ref 30.0–36.0)
MCV: 86.8 fL (ref 78.0–100.0)
Platelets: 173 10*3/uL (ref 150–400)
RDW: 14.3 % (ref 11.5–15.5)
WBC: 8.1 10*3/uL (ref 4.0–10.5)

## 2012-08-18 LAB — BASIC METABOLIC PANEL
Calcium: 8.6 mg/dL (ref 8.4–10.5)
Creatinine, Ser: 0.68 mg/dL (ref 0.50–1.10)
GFR calc Af Amer: >90 mL/min (ref >90)
GFR calc non Af Amer: 89 mL/min — ABNORMAL LOW (ref >90)

## 2012-08-18 MED ORDER — METHOCARBAMOL 500 MG PO TABS: 500.0000 mg | ORAL_TABLET | Freq: Four times a day (QID) | ORAL | Status: DC | PRN

## 2012-08-18 MED ORDER — ASPIRIN 325 MG PO TBEC: 325.0000 mg | DELAYED_RELEASE_TABLET | Freq: Two times a day (BID) | ORAL | Status: DC

## 2012-08-18 MED ORDER — OXYCODONE HCL 5 MG PO TABS: 5.0000 mg | ORAL_TABLET | ORAL | Status: DC | PRN

## 2012-08-18 NOTE — Progress Notes (Signed)
Physical Therapy Treatment Patient Details Name: Kristina Perkins MRN: [id number] DOB: 03-27-1946 Today's Date: 08/18/2012 Time: 5284-1324 PT Time Calculation (min): 26 min  PT Assessment / Plan / Recommendation Comments on Treatment Session  Patient has made steady progress towards PT goals.  Patient is ambulating safely and was able to perform both stair and curb negotiation safely and successfully.  Feel patient will be safe for discharge home with home PT to follow.; patient will need rolling walker for discharge.    Follow Up Recommendations  Home health PT    Barriers to Discharge        Equipment Recommendations  Rolling walker with 5" wheels    Recommendations for Other Services    Frequency 7X/week   Plan Discharge plan remains appropriate    Precautions / Restrictions Precautions Precautions: Knee Restrictions Weight Bearing Restrictions: Yes RLE Weight Bearing: Weight bearing as tolerated   Pertinent Vitals/Pain 5/10    Mobility  Bed Mobility Bed Mobility: Not assessed Transfers Transfers: Sit to Stand;Stand to Sit Sit to Stand: 5: Supervision Stand to Sit: 5: Supervision Ambulation/Gait Ambulation/Gait Assistance: 5: Supervision Ambulation Distance (Feet): 200 Feet Assistive device: Rolling walker Gait Pattern: Step-through pattern;Decreased stride length Gait velocity: decreased General Gait Details: Patient able to tolerate increased ambulation.  Pt appears steady with no signs of buckling today. Stairs: Yes Stair Management Technique: Backwards;With walker Number of Stairs: 3  (technique and sequencing provided; patient performed x2)      PT Goals Acute Rehab PT Goals Pt will Stand: with modified independence PT Goal: Stand - Progress: Progressing toward goal Pt will Ambulate: >150 feet;with modified independence;with rolling walker PT Goal: Ambulate - Progress: Progressing toward goal Pt will Go Up / Down Stairs: 3-5 stairs;with min assist;with  rolling walker PT Goal: Up/Down Stairs - Progress: Met Pt will Perform Home Exercise Program: Independently PT Goal: Perform Home Exercise Program - Progress: Progressing toward goal  Visit Information  Last PT Received On: 08/18/12 Assistance Needed: +1    Subjective Data  Subjective: I feel a little stiff  Patient Stated Goal: to go home   Cognition  Overall Cognitive Status: Appears within functional limits for tasks assessed/performed Arousal/Alertness: Awake/alert Orientation Level: Appears intact for tasks assessed;Oriented X4 / Intact Behavior During Session: [facility] for tasks performed    Balance     End of Session PT - End of Session Equipment Utilized During Treatment: Gait belt;Right knee immobilizer Activity Tolerance: Patient tolerated treatment well Patient left: in chair;with call bell/phone within reach Nurse Communication: Mobility status   GP     [name] 08/18/2012, 10:12 AM [name], PT DPT  [phone]

## 2012-08-18 NOTE — Evaluation (Signed)
Occupational Therapy Evaluation Patient Details Name: Kristina Perkins MRN: [id number] DOB: 1946-08-23 Today's Date: 08/18/2012 Time: 4098-1191 OT Time Calculation (min): 20 min  OT Assessment / Plan / Recommendation Clinical Impression  66 yo female s/p Rt TKA that does not require skilled OT acutely. Ot to sign off    OT Assessment  Patient does not need any further OT services    Follow Up Recommendations  No OT follow up    Barriers to Discharge      Equipment Recommendations  Rolling walker with 5" wheels    Recommendations for Other Services    Frequency       Precautions / Restrictions Precautions Precautions: Knee Restrictions Weight Bearing Restrictions: Yes RLE Weight Bearing: Weight bearing as tolerated   Pertinent Vitals/Pain 6 out 10 pain Ice provided    ADL  Grooming: Performed;Wash/dry face;Independent (nose bleed ) Where Assessed - Grooming: Supported sitting Lower Body Dressing: Simulated;Modified independent (able to reach bil toes no ae needs) Where Assessed - Lower Body Dressing: Unsupported sitting Toilet Transfer: Simulated;Modified independent Toilet Transfer Method: Sit to Barista: Raised toilet seat with arms (or 3-in-1 over toilet) Equipment Used: Rolling walker Transfers/Ambulation Related to ADLs: Pt demonstrated shower transfer using glove box as 4 inch rise for shower edge. Pt demonstrated at S level. Pt provided hand out.  ADL Comments: Pt is at adequate level for dc home with spouse/sister (A). Pt has all necessary DME. Ot to sign off      OT Goals    Visit Information  Last OT Received On: 08/18/12 Assistance Needed: +1    Subjective Data  Subjective: "I have my sister and husband to help" Patient Stated Goal: to go home today   Prior Functioning     Home Living Lives With: Spouse;Family Available Help at Discharge: Family;Available 24 hours/day Type of Home: House Home Access: Stairs to  enter [employer] of Steps: 2 Entrance Stairs-Rails: None Home Layout: One level Bathroom Shower/Tub: Health visitor: Handicapped height Home Adaptive Equipment: Environmental consultant - standard;Straight cane Prior Function Level of Independence: Independent Able to Take Stairs?: Yes Driving: Yes Vocation: Full time employment Comments:  (works a Office manager) Musician: No difficulties Dominant Hand: Right         Vision/Perception     Cognition  Overall Cognitive Status: Appears within functional limits for tasks assessed/performed Arousal/Alertness: Awake/alert Orientation Level: Appears intact for tasks assessed;Oriented X4 / Intact Behavior During Session: [facility] for tasks performed    Extremity/Trunk Assessment Right Upper Extremity Assessment RUE ROM/Strength/Tone: [facility] for tasks assessed Left Upper Extremity Assessment LUE ROM/Strength/Tone: WFL for tasks assessed     Mobility Bed Mobility Bed Mobility: Not assessed (pt has leg lifter at home ) Transfers Sit to Stand: 6: Modified independent (Device/Increase time) Stand to Sit: 6: Modified independent (Device/Increase time)     Shoulder Instructions     Exercise     Balance     End of Session OT - End of Session Activity Tolerance: Patient tolerated treatment well Patient left: in chair;with call bell/phone within reach (ice pack provided) Nurse Communication: Mobility status;Precautions  GO     Lucile Shutters 08/18/2012, 11:20 AM Pager: [phone]

## 2012-08-18 NOTE — Progress Notes (Signed)
Subjective: 2 Days Post-Op Procedure(s) (LRB): TOTAL KNEE ARTHROPLASTY (Right)  Activity level:  oob with pt Diet tolerance:  eating Voiding:  ok Patient reports pain as 1 on 0-10 scale.    Objective: Vital signs in last 24 hours: Temp:  [99.2 F (37.3 C)-99.5 F (37.5 C)] 99.5 F (37.5 C) (09/26 0600) Pulse Rate:  [97-107] 97  (09/26 0600) Resp:  [16-18] 16  (09/26 0600) BP: (124-126)/(42-57) 126/57 mmHg (09/26 0600) SpO2:  [90 %-95 %] 95 % (09/26 0600)  Labs:  Basename 08/18/12 0440 08/17/12 0452  HGB 11.1* 11.8*    Basename 08/18/12 0440 08/17/12 0452  WBC 8.1 8.7  RBC 3.86* 4.19  HCT 33.5* 37.2  PLT 173 187    Basename 08/18/12 0440 08/17/12 0452  NA 135 138  K 3.3* 4.3  CL 97 100  CO2 28 25  BUN 11 14  CREATININE 0.68 0.62  GLUCOSE 179* 139*  CALCIUM 8.6 8.7   No results found for this basename: LABPT:2,INR:2 in the last 72 hours  Physical Exam:  Neurologically intact ABD soft Neurovascular intact Sensation intact distally Intact pulses distally Dorsiflexion/Plantar flexion intact No cellulitis present Compartment soft Dressing changed  Assessment/Plan:  2 Days Post-Op Procedure(s) (LRB): TOTAL KNEE ARTHROPLASTY (Right) Advance diet Up with therapy Discharge home with home health    Jameison Haji R 08/18/2012, 2:32 PM

## 2012-08-18 NOTE — Progress Notes (Signed)
Physical Therapy Treatment Patient Details Name: Kristina Perkins MRN: [id number] DOB: 1946/01/04 Today's Date: 08/18/2012 Time: 4098-1191 PT Time Calculation (min): 25 min  PT Assessment / Plan / Recommendation Comments on Treatment Session  Patient educated on HEp in preparation for discharge. Provided patient and sister with instructions regarding HEP program.  no other questions or concerns to address at this time; feel patient is well prepared and safe for discharge home with Home PT.    Follow Up Recommendations  Home health PT    Barriers to Discharge        Equipment Recommendations  Rolling walker with 5" wheels    Recommendations for Other Services    Frequency 7X/week   Plan Discharge plan remains appropriate    Precautions / Restrictions Precautions Precautions: Knee Restrictions Weight Bearing Restrictions: Yes RLE Weight Bearing: Weight bearing as tolerated   Pertinent Vitals/Pain 6/10    Mobility  Bed Mobility Bed Mobility: Not assessed Transfers Transfers: Not assessed    Exercises Total Joint Exercises Ankle Circles/Pumps: AROM;Both;10 reps Quad Sets: Strengthening;10 reps;Right Towel Squeeze: Strengthening;10 reps;Right Short Arc Quad: Strengthening;10 reps;Right Heel Slides: Strengthening;10 reps;Right Hip ABduction/ADduction: Strengthening;10 reps;Right Straight Leg Raises: Strengthening;10 reps;Right Long Arc Quad: Strengthening;10 reps;Right Knee Flexion: AROM;Right;10 reps     PT Goals Acute Rehab PT Goals PT Goal: Perform Home Exercise Program - Progress: Met  Visit Information  Last PT Received On: 08/18/12 Assistance Needed: +1    Subjective Data  Subjective: I'm waiting on pain medicine Patient Stated Goal: to go home   Cognition  Overall Cognitive Status: Appears within functional limits for tasks assessed/performed Arousal/Alertness: Awake/alert Orientation Level: Appears intact for tasks assessed;Oriented X4 / Intact Behavior  During Session: [name] for tasks performed       End of Session PT - End of Session Patient left: in bed;with call bell/phone within reach;with family/visitor present Nurse Communication: Mobility status   GP     Kristina Perkins 08/18/2012, 3:25 PM Kristina Perkins, PT DPT  [phone]

## 2012-08-18 NOTE — Discharge Summary (Signed)
Patient ID: Kristina Perkins MRN: [id number] DOB/AGE: [birth year] 66 y.o.  Admit date: 08/16/2012 Discharge date: 08/18/2012  Admission Diagnoses:  Active Problems:  Right knee DJD   Discharge Diagnoses:  Same  Past Medical History  Diagnosis Date  . Hypertension   . Asthma     hx of  . Sleep apnea     uses CPAP sleep study done at [facility]  . Cataracts, bilateral   . Pneumonia     no recent history of  . Depression   . Pneumothorax, traumatic 1974  . Traumatic injury of the kidney     38 years ago  . Multiple closed fractures of ribs of left side 1974  . Fracture, sternum closed 1974  . Fracture of left clavicle 1974  . SOB (shortness of breath) on exertion   . Diabetes mellitus     "I'm diabetic when I'm pregnant"  . Melanoma 1990    "on my back"  . Difficult intubation     "I am a terrible intubation; took over 45 min 11/03/11"  . Degenerative joint disease   . Osteoarthritis of knee     "some of my joints"  . DJD (degenerative joint disease)     Surgeries: Procedure(s): TOTAL KNEE ARTHROPLASTY on 08/16/2012   Consultants:    Discharged Condition: Improved  Hospital Course: RAASHIDA Perkins is an 66 y.o. female who was admitted 08/16/2012 for operative treatment of<principal problem not specified>. Patient has severe unremitting pain that affects sleep, daily activities, and work/hobbies. After pre-op clearance the patient was taken to the operating room on 08/16/2012 and underwent  Procedure(s): TOTAL KNEE ARTHROPLASTY.    Patient was given perioperative antibiotics: Anti-infectives     Start     Dose/Rate Route Frequency Ordered Stop   08/16/12 2215   ceFAZolin (ANCEF) IVPB 2 g/50 mL premix        2 g 100 mL/hr over 30 Minutes Intravenous Every 6 hours 08/16/12 1952 08-27-12 0353   08/16/12 1503   cefUROXime (ZINACEF) injection  Status:  Discontinued          As needed 08/16/12 1503 08/16/12 1610   08/15/12 1503   ceFAZolin (ANCEF) IVPB 2 g/50 mL premix          2 g 100 mL/hr over 30 Minutes Intravenous 60 min pre-op 08/15/12 1504 08/16/12 1408           Patient was given sequential compression devices, early ambulation, and chemoprophylaxis to prevent DVT.  Patient benefited maximally from hospital stay and there were no complications.    Recent vital signs: Patient Vitals for the past 24 hrs:  BP Temp Pulse Resp SpO2  08/18/12 0600 126/57 mmHg 99.5 F (37.5 C) 97  16  95 %  2012-08-27 2325 - - 102  18  95 %  2012-08-27 2139 124/42 mmHg 99.2 F (37.3 C) 107  18  90 %     Recent laboratory studies:  Basename 08/18/12 0440 08/27/12 0452  WBC 8.1 8.7  HGB 11.1* 11.8*  HCT 33.5* 37.2  PLT 173 187  NA 135 138  K 3.3* 4.3  CL 97 100  CO2 28 25  BUN 11 14  CREATININE 0.68 0.62  GLUCOSE 179* 139*  INR -- --  CALCIUM 8.6 --     Discharge Medications:     Medication List     As of 08/18/2012  2:38 PM    STOP taking these medications  diphenhydramine-acetaminophen 25-500 MG Tabs   Commonly known as: TYLENOL PM      meloxicam 15 MG tablet   Commonly known as: MOBIC      TAKE these medications         ALPRAZolam 0.25 MG tablet   Commonly known as: XANAX   Take 0.25 mg by mouth 3 (three) times daily as needed. For anxiety      aspirin 325 MG EC tablet   Take 1 tablet (325 mg total) by mouth 2 (two) times daily.      losartan-hydrochlorothiazide 50-12.5 MG per tablet   Commonly known as: HYZAAR   Take 1 tablet by mouth daily.      methocarbamol 500 MG tablet   Commonly known as: ROBAXIN   Take 1 tablet (500 mg total) by mouth every 6 (six) hours as needed.      oxyCODONE 5 MG immediate release tablet   Commonly known as: Oxy IR/ROXICODONE   Take 1-2 tablets (5-10 mg total) by mouth every 3 (three) hours as needed.      zolpidem 10 MG tablet   Commonly known as: AMBIEN   Take 10 mg by mouth at bedtime as needed. For sleep        Diagnostic Studies: Dg Chest 2 View  08/04/2012  *RADIOLOGY REPORT*   Clinical Data: Preop for right knee arthroplasty  CHEST - 2 VIEW  Comparison: 10/23/2011  Findings: Cardiomediastinal silhouette is stable.  Stable chronic blunting of the right costophrenic angle.  Mild degenerative changes thoracic spine.  Stable elevation of the right hemidiaphragm.  No acute infiltrate or pulmonary edema.  IMPRESSION: No active disease.  No significant change.   Original Report Authenticated By: Kristina Perkins, M.D.     Disposition: 06-Home-Health Care Svc      Discharge Orders    Future Orders Please Complete By Expires   Diet - low sodium heart healthy      Call MD / Call 911      Comments:   If you experience chest pain or shortness of breath, CALL 911 and be transported to the hospital emergency room.  If you develope a fever above 101 F, pus (white drainage) or increased drainage or redness at the wound, or calf pain, call your surgeon's office.   Constipation Prevention      Comments:   Drink plenty of fluids.  Prune juice may be helpful.  You may use a stool softener, such as Colace (over the counter) 100 mg twice a day.  Use MiraLax (over the counter) for constipation as needed.   Increase activity slowly as tolerated         Follow-up Information    Follow up with Velna Ochs, MD. Call in 12 days.   Contact information:   [address] [place] [address] [phone]           Signed: Prince Perkins 08/18/2012, 2:38 PM

## 2012-08-18 NOTE — Progress Notes (Signed)
CARE MANAGEMENT NOTE 08/18/2012  Patient:  Kristina Perkins, Kristina Perkins   Account Number:  [id number]  Date Initiated:  08/18/2012  Documentation initiated by:  Vance Peper  Subjective/Objective Assessment:   66 yr old female s/p right total knee arthroplasty.     Action/Plan:   CM spoke with patient regarding home health and DME needs at discharge. Patient preoperatively setup with [facility], no changes. DME has been delivered.   Anticipated DC Date:  08/18/2012   Anticipated DC Plan:  HOME W HOME HEALTH SERVICES      DC Planning Services  CM consult      [facility] Choice  HOME HEALTH   Choice offered to / List presented to:  C-1 Patient        HH arranged  HH-2 PT      [facility] agency  [facility]   Status of service:  Completed, signed off Medicare Important Message given?   (If response is "NO", the following Medicare IM given date fields will be blank) Date Medicare IM given:   Date Additional Medicare IM given:    Discharge Disposition:  HOME W HOME HEALTH SERVICES  Per UR Regulation:    If discussed at Long Length of Stay Meetings, dates discussed:    Comments:

## 2012-08-18 NOTE — Progress Notes (Signed)
Occupational Therapy Discharge Patient Details Name: Kristina Perkins MRN: [id number] DOB: 1946/07/10 Today's Date: 08/18/2012 Time: 4098-1191 OT Time Calculation (min): 20 min  Patient discharged from OT services secondary to goals met and no further OT needs identified.  Please see latest therapy progress note for current level of functioning and progress toward goals.    Progress and discharge plan discussed with patient and/or caregiver: Patient/Caregiver agrees with plan  GO     Lucile Shutters Pager: [phone]  08/18/2012, 11:20 AM

## 2012-12-01 ENCOUNTER — Other Ambulatory Visit (HOSPITAL_COMMUNITY): Payer: Self-pay | Admitting: Family Medicine

## 2012-12-01 DIAGNOSIS — Z1231 Encounter for screening mammogram for malignant neoplasm of breast: Secondary | ICD-10-CM

## 2012-12-02 ENCOUNTER — Other Ambulatory Visit: Payer: Self-pay

## 2012-12-08 ENCOUNTER — Ambulatory Visit (HOSPITAL_COMMUNITY)
Admission: RE | Admit: 2012-12-08 | Discharge: 2012-12-08 | Disposition: A | Discharge status: Home / Self Care | Payer: 59 | Source: Ambulatory Visit | Attending: Family Medicine | Admitting: Family Medicine

## 2012-12-08 DIAGNOSIS — Z1231 Encounter for screening mammogram for malignant neoplasm of breast: Secondary | ICD-10-CM

## 2013-04-14 IMAGING — CR DG CHEST 2V
2 series · 2 of 2 positions shown · non-contrast
Comparison: 07/21/2005.

CLINICAL DATA: Preop.

CHEST - 2 VIEW

[view not recorded (1 of 2)]
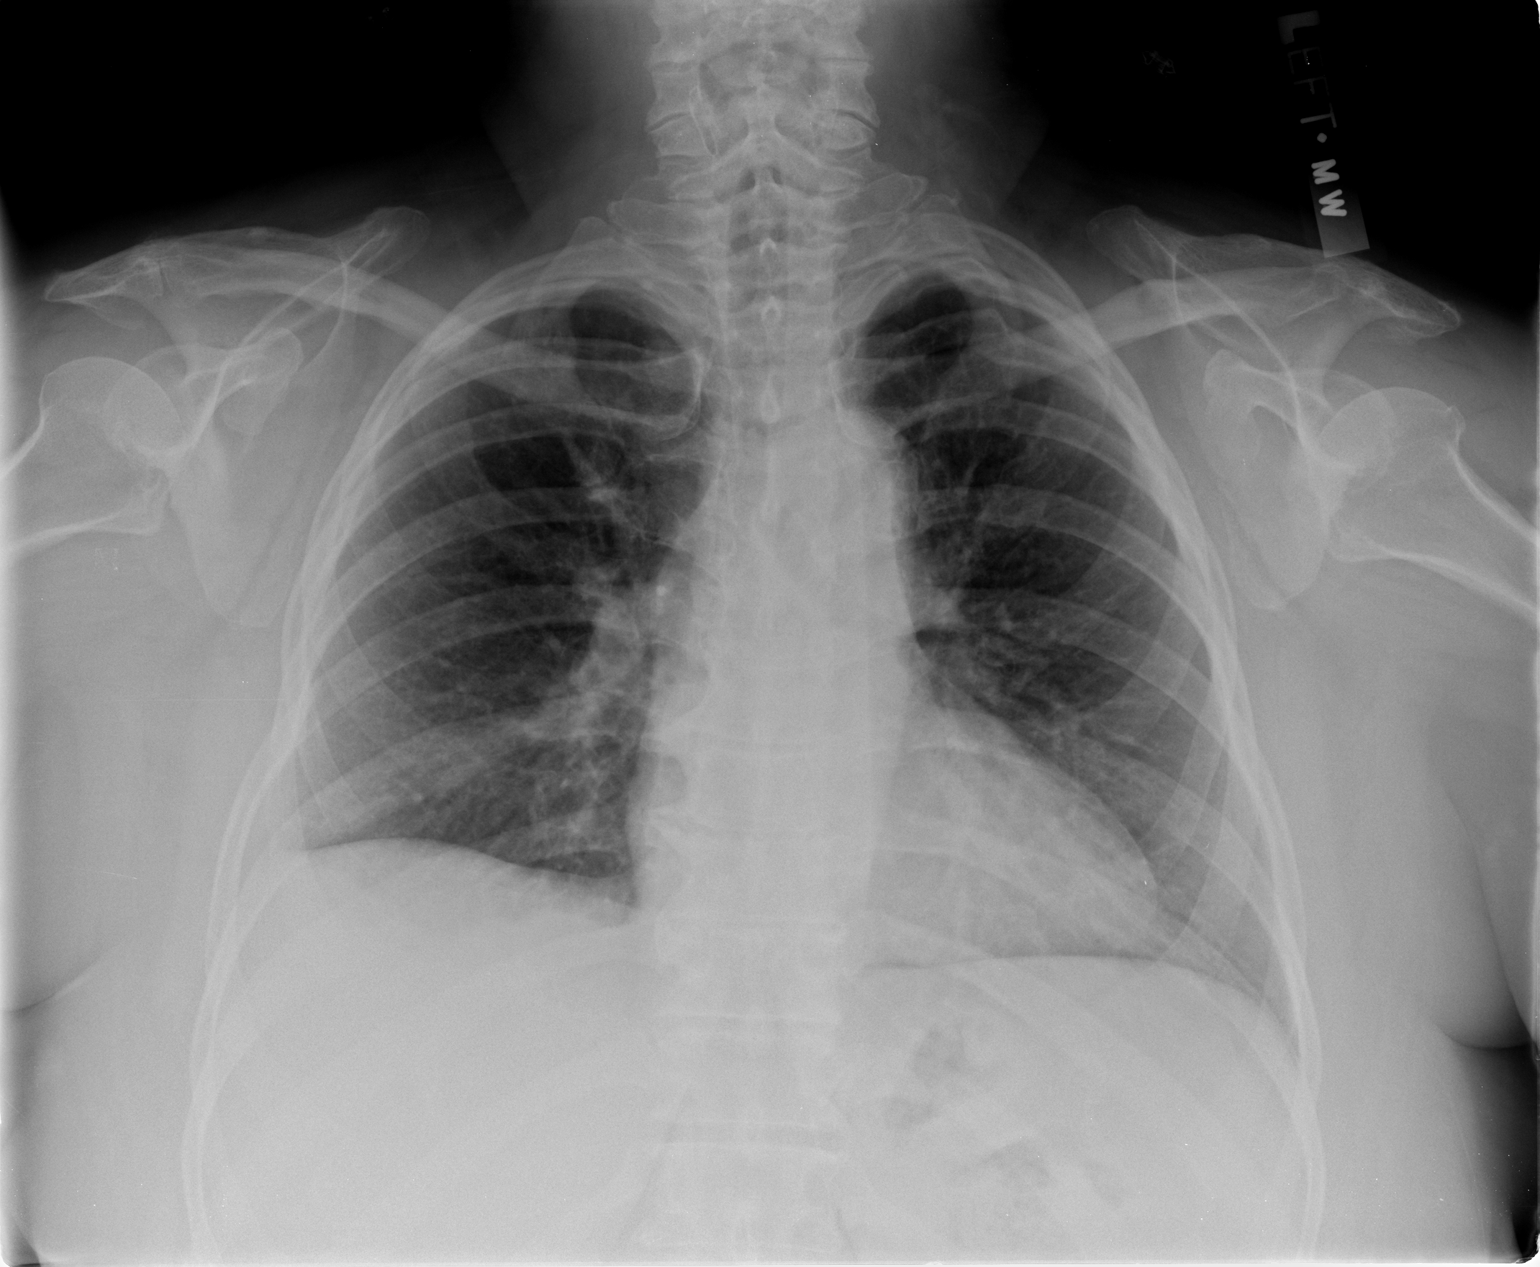

[view not recorded (2 of 2)]
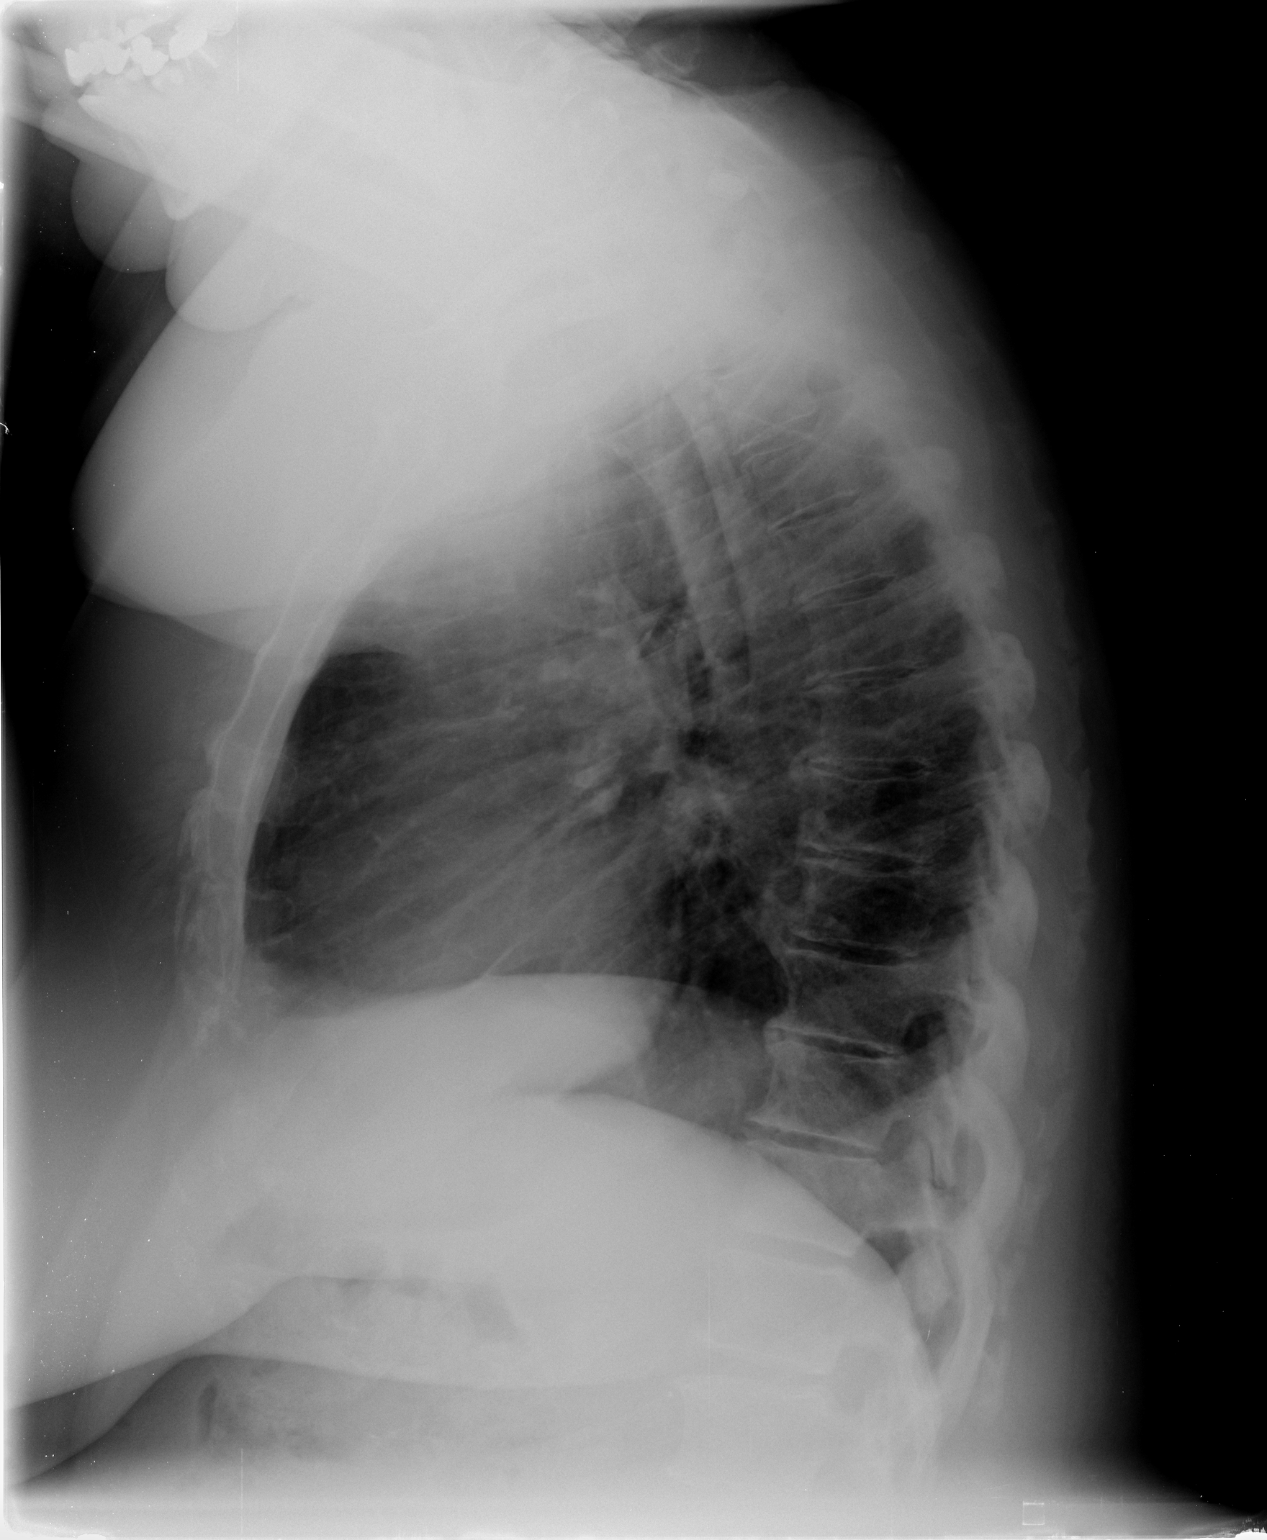

[2 of 2 positions shown; findings below may reference images not displayed]

FINDINGS: Trachea is midline.  Heart size normal.  Lungs are clear
but somewhat low in volume.  No pleural fluid.  Mild degenerative
changes are seen in the spine.
IMPRESSION: No acute findings.

## 2013-04-14 IMAGING — US US TRANSVAGINAL NON-OB
1 series · 13 of 25 positions shown · non-contrast
Comparison: None.

CLINICAL DATA: Pelvic mass.  Pelvic and back pain.

TRANSABDOMINAL AND TRANSVAGINAL ULTRASOUND OF PELVIS
TECHNIQUE: Both transabdominal and transvaginal ultrasound
examinations of the pelvis were performed. Transabdominal technique
was performed for global imaging of the pelvis including uterus,
ovaries, adnexal regions, and pelvic cul-de-sac.
It was necessary to proceed with endovaginal exam following the
transabdominal exam to visualize the left adnexal mass and ovaries.

[Series 1: us pelvis complete · 67 acquisitions, 13 frames shown]
[im 1/67]
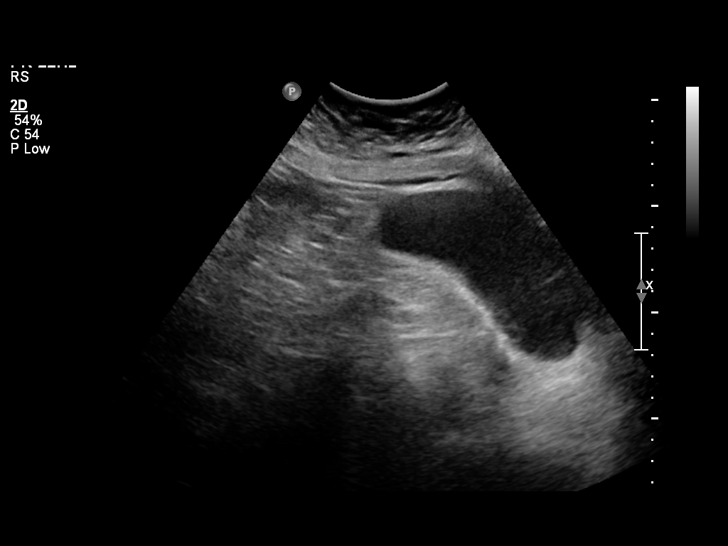
[im 6/67]
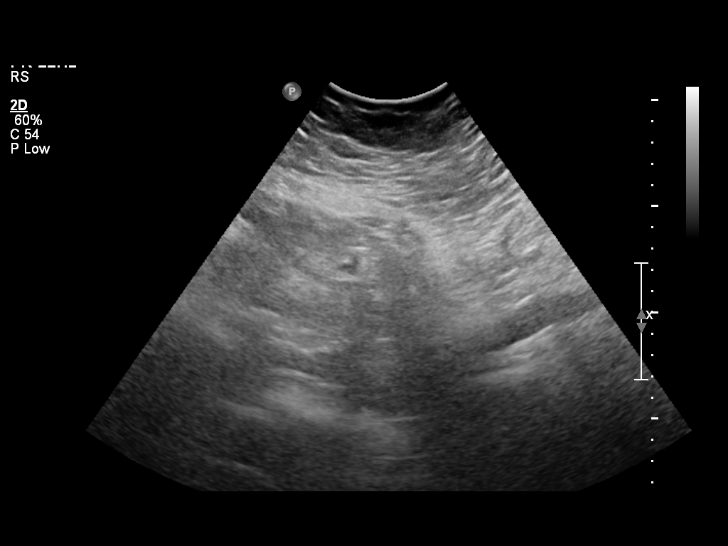
[im 12/67]
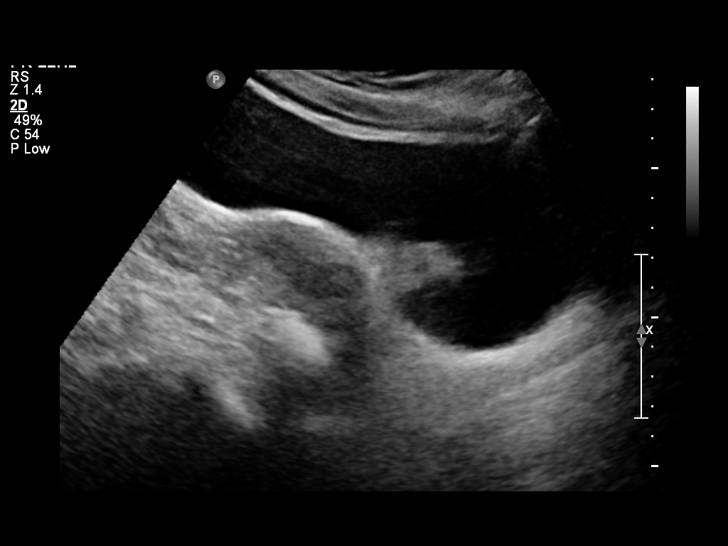
[im 17/67]
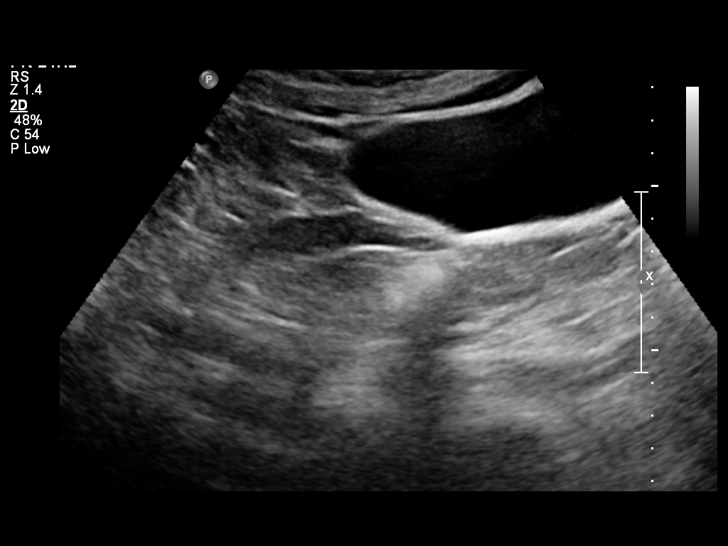
[im 23/67]
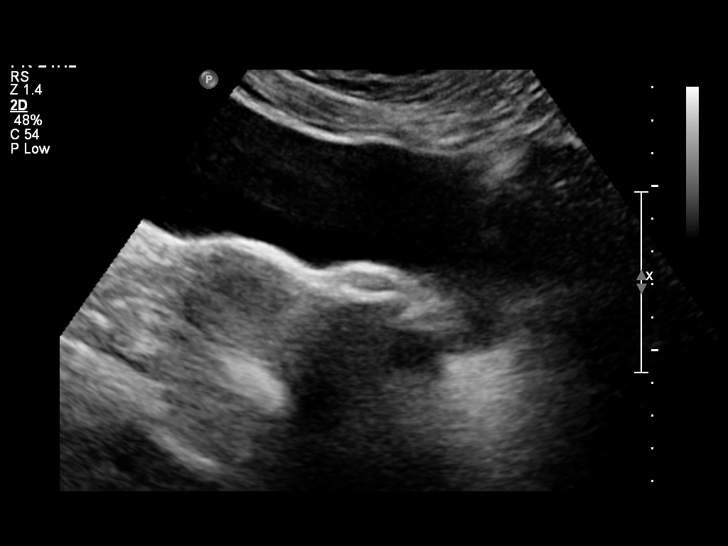
[im 28/67]
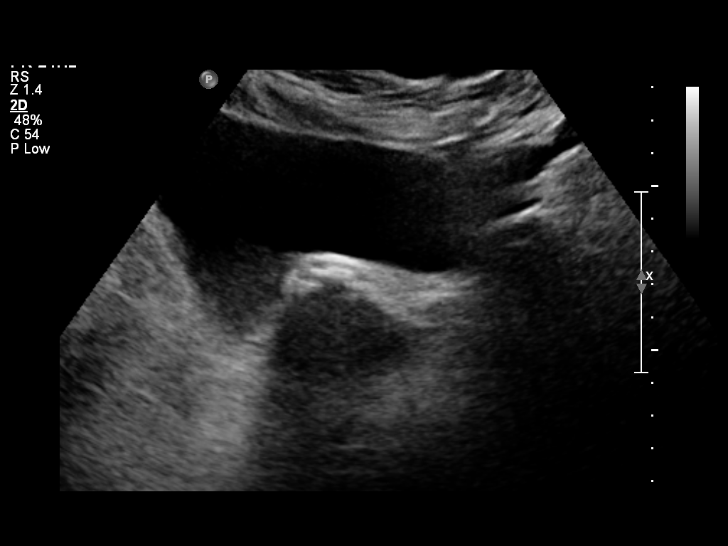
[im 34/67]
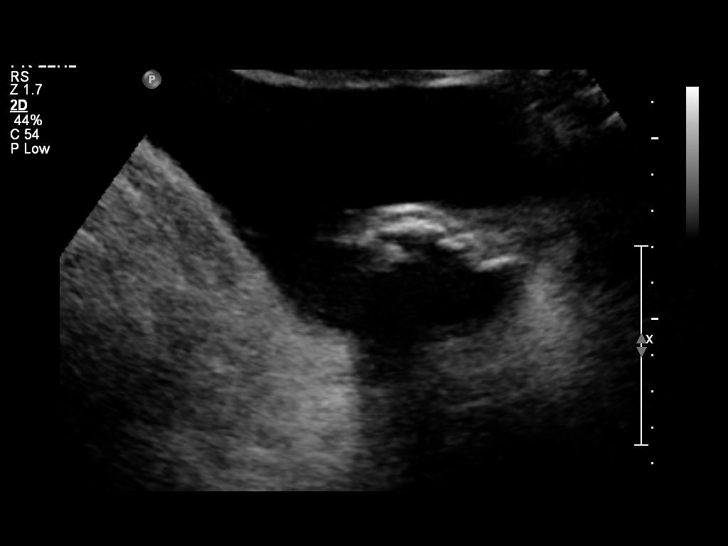
[im 39/67]
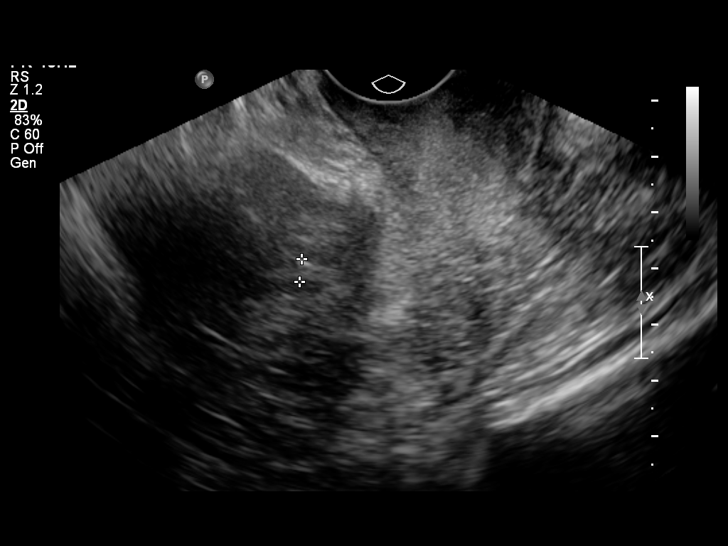
[im 45/67]
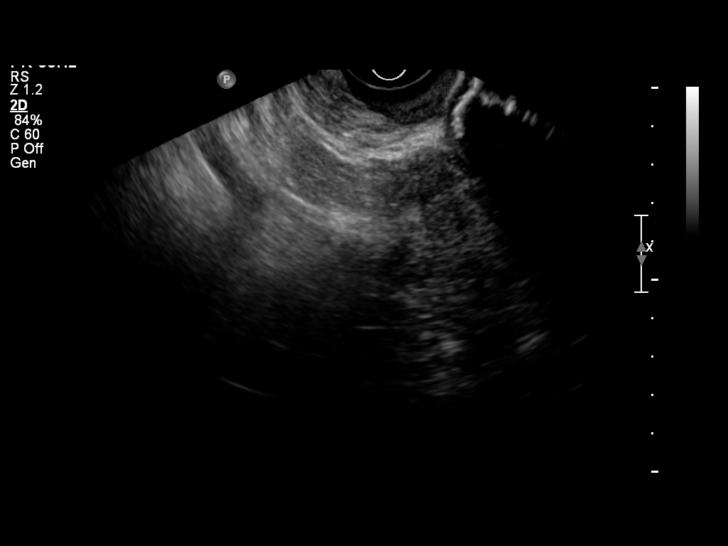
[im 50/67]
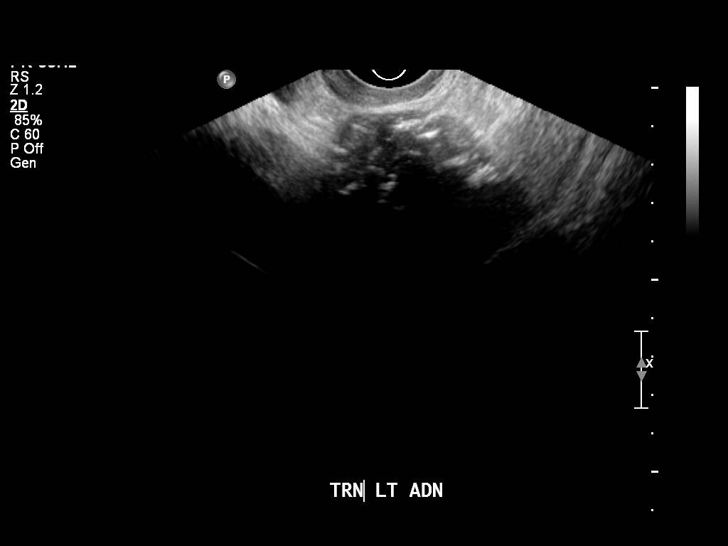
[im 56/67]
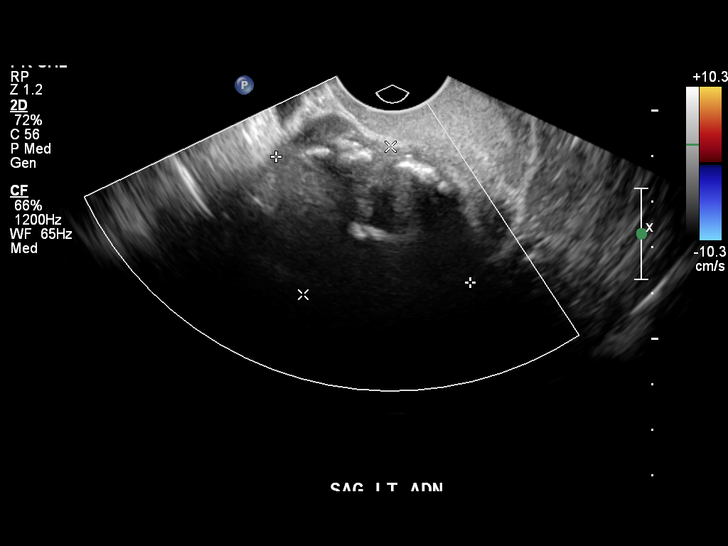
[im 61/67]
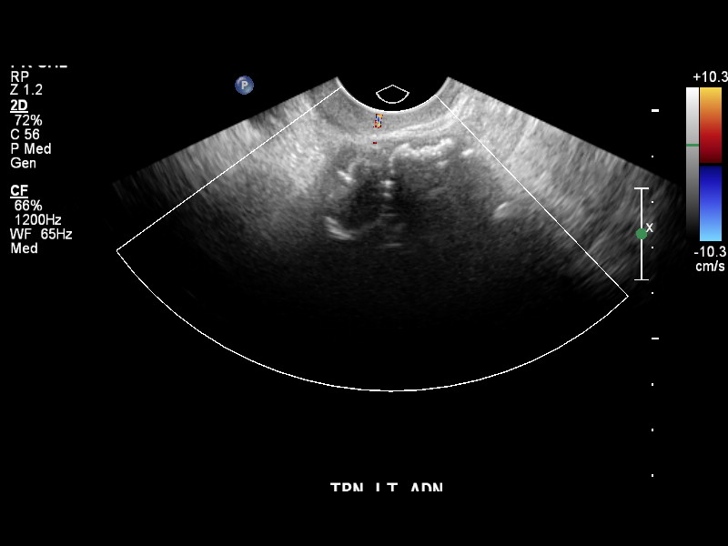
[im 67/67]
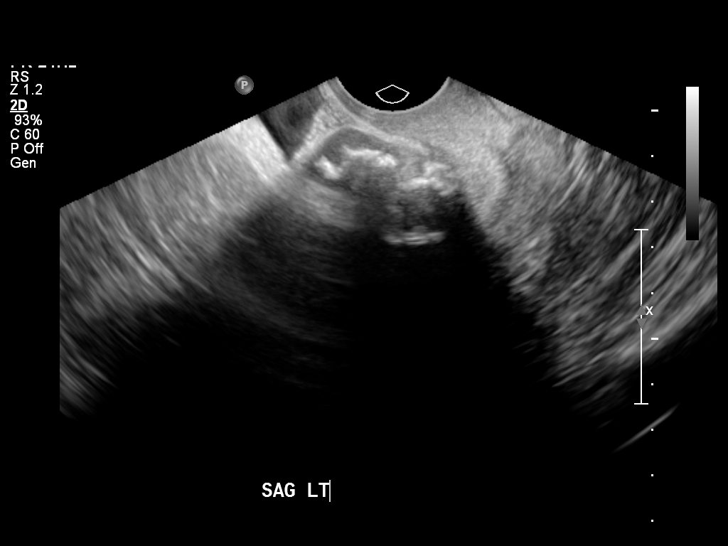

[13 of 25 positions shown; findings below may reference images not displayed]

FINDINGS: Uterus:  8.6 x 3.1 x 4.6 cm.  Visualization is suboptimal on both
transabdominal and transvaginal sonography due to acoustic
shadowing resulting from a calcified left pelvic soft tissue mass.
No other uterine fibroids identified.

Endometrium: Double layer thickness measures 4 mm.

Right ovary: 3.0 x 1.3 x 2.9 cm.  Normal appearance.

Left ovary: Not visualized.  A calcified soft tissue mass is seen
in the left adnexa. Acoustic shadowing from calcification limits
visualization of the mass borders and its relationship with the
uterus and other pelvic anatomy.  This mass measures at least 5.1 x
3.8 x 4.8 cm.

Other Findings:  No free fluid
IMPRESSION: 1. Calcified left adnexal soft tissue mass measuring at least 5 cm,
although exact size and origin of this mass is difficult to
determine due to acoustic shadowing.  This may represent a
calcified fibroid, however other pelvic soft tissue masses cannot
be excluded.  Pelvic MRI without and with contrast is recommended
for further evaluation.
2.  Normal right ovary.  No evidence of free fluid.

## 2013-09-27 ENCOUNTER — Ambulatory Visit: Payer: 59

## 2013-10-25 ENCOUNTER — Other Ambulatory Visit: Payer: Self-pay | Admitting: Gastroenterology

## 2013-10-26 ENCOUNTER — Encounter: Payer: 59 | Attending: Family Medicine

## 2013-10-26 VITALS — Ht 66.0 in | Wt 255.2 lb

## 2013-10-26 DIAGNOSIS — Z713 Dietary counseling and surveillance: Secondary | ICD-10-CM | POA: Insufficient documentation

## 2013-10-26 DIAGNOSIS — E119 Type 2 diabetes mellitus without complications: Secondary | ICD-10-CM

## 2013-10-30 NOTE — Progress Notes (Signed)
Patient was seen on 10/26/13 for the first of a series of three diabetes self-management courses at the [facility].  Current HbA1c: 6.2%  The following learning objectives were met by the patient during this class:  Describe diabetes  State some common risk factors for diabetes  Defines the role of glucose and insulin  Identifies type of diabetes and pathophysiology  Describe the relationship between diabetes and cardiovascular risk  State the members of the Healthcare Team  States the rationale for glucose monitoring  State when to test glucose  State their individual Target Range  State the importance of logging glucose readings  Describe how to interpret glucose readings  Identifies A1C target  Explain the correlation between A1c and eAG values  State symptoms and treatment of high blood glucose  State symptoms and treatment of low blood glucose  Explain proper technique for glucose testing  Identifies proper sharps disposal  Handouts given during class include:  Living Well with Diabetes book  Carb Counting and Meal Planning book  Meal Plan Card  Carbohydrate guide  Meal planning worksheet  Low Sodium Flavoring Tips  The diabetes portion plate  Low Carbohydrate Snack Suggestions  A1c to eAG Conversion Chart  Diabetes Medications  Stress Management  Diabetes Recommended Care Schedule  Diabetes Success Plan  Core Class Satisfaction Survey  Your patient has identified their diabetes care support plan as:  [facility]  Staff  Follow-Up Plan:  Attend core 2

## 2013-11-02 ENCOUNTER — Ambulatory Visit: Payer: 59

## 2013-11-09 ENCOUNTER — Ambulatory Visit: Payer: 59

## 2013-11-14 DIAGNOSIS — E119 Type 2 diabetes mellitus without complications: Secondary | ICD-10-CM

## 2013-11-14 NOTE — Progress Notes (Signed)

## 2013-11-21 DIAGNOSIS — E119 Type 2 diabetes mellitus without complications: Secondary | ICD-10-CM

## 2013-11-21 NOTE — Progress Notes (Signed)
Patient was seen on 11/21/13 for the third of a series of three diabetes self-management courses at the [facility]. The following learning objectives were met by the patient during this class:    State the amount of activity recommended for healthy living   Describe activities suitable for individual needs   Identify ways to regularly incorporate activity into daily life   Identify barriers to activity and ways to over come these barriers  Identify diabetes medications being personally used and their primary action for lowering glucose and possible side effects   Describe role of stress on blood glucose and develop strategies to address psychosocial issues   Identify diabetes complications and ways to prevent them  Explain how to manage diabetes during illness   Evaluate success in meeting personal goal   Establish 2-3 goals that they will plan to diligently work on until they return for the  58-month follow-up visit  Goals:  Follow Diabetes Meal Plan as instructed  Aim for 15-30 mins of physical activity daily as tolerated  Bring food record and glucose log to your follow up visit  Your patient has established the following 4 month goals in their individualized success plan:  Count carbohydrates at most meals and snacks  Be active 20 minutes or more 2 times a week  Your patient has identified these potential barriers to change:  Snacking without paying attention to counting carbs  Weather may keep me from walking  Your patient has identified their diabetes self-care support plan as  Husband  [employer]

## 2014-01-01 ENCOUNTER — Ambulatory Visit
Admission: RE | Admit: 2014-01-01 | Discharge: 2014-01-01 | Disposition: A | Discharge status: Home / Self Care | Payer: 59 | Source: Ambulatory Visit | Attending: Family Medicine | Admitting: Family Medicine

## 2014-01-01 ENCOUNTER — Other Ambulatory Visit: Payer: Self-pay | Admitting: Family Medicine

## 2014-01-01 DIAGNOSIS — R109 Unspecified abdominal pain: Secondary | ICD-10-CM

## 2014-02-16 ENCOUNTER — Other Ambulatory Visit: Payer: Self-pay | Admitting: Orthopedic Surgery

## 2014-02-19 ENCOUNTER — Encounter (HOSPITAL_BASED_OUTPATIENT_CLINIC_OR_DEPARTMENT_OTHER): Payer: Self-pay | Admitting: *Deleted

## 2014-02-19 NOTE — Progress Notes (Signed)
Will come in for bmet-ekg-uses a cpap-will bring Pt VERY DIFFICULT INTUBATION

## 2014-02-20 ENCOUNTER — Encounter (HOSPITAL_BASED_OUTPATIENT_CLINIC_OR_DEPARTMENT_OTHER)
Admission: RE | Admit: 2014-02-20 | Discharge: 2014-02-20 | Disposition: A | Discharge status: Home / Self Care | Payer: 59 | Source: Ambulatory Visit | Attending: Orthopedic Surgery | Admitting: Orthopedic Surgery

## 2014-02-20 ENCOUNTER — Other Ambulatory Visit: Payer: Self-pay

## 2014-02-20 LAB — BASIC METABOLIC PANEL
BUN: 16 mg/dL (ref 6–23)
CO2: 26 mEq/L (ref 19–32)
Calcium: 9.2 mg/dL (ref 8.4–10.5)
Chloride: 101 mEq/L (ref 96–112)
Creatinine, Ser: 0.61 mg/dL (ref 0.50–1.10)
Glucose, Bld: 116 mg/dL — ABNORMAL HIGH (ref 70–99)
POTASSIUM: 4.2 meq/L (ref 3.7–5.3)
SODIUM: 143 meq/L (ref 137–147)

## 2014-02-22 ENCOUNTER — Encounter (HOSPITAL_BASED_OUTPATIENT_CLINIC_OR_DEPARTMENT_OTHER): Payer: Self-pay | Admitting: *Deleted

## 2014-02-22 ENCOUNTER — Encounter (HOSPITAL_BASED_OUTPATIENT_CLINIC_OR_DEPARTMENT_OTHER)
Admission: RE | Disposition: A | Discharge status: Home / Self Care | Payer: Self-pay | Source: Ambulatory Visit | Attending: Orthopedic Surgery

## 2014-02-22 ENCOUNTER — Ambulatory Visit (HOSPITAL_BASED_OUTPATIENT_CLINIC_OR_DEPARTMENT_OTHER): Payer: 59 | Admitting: Anesthesiology

## 2014-02-22 ENCOUNTER — Encounter (HOSPITAL_BASED_OUTPATIENT_CLINIC_OR_DEPARTMENT_OTHER): Payer: 59 | Admitting: Anesthesiology

## 2014-02-22 ENCOUNTER — Ambulatory Visit (HOSPITAL_BASED_OUTPATIENT_CLINIC_OR_DEPARTMENT_OTHER)
Admission: RE | Admit: 2014-02-22 | Discharge: 2014-02-22 | Disposition: A | Discharge status: Home / Self Care | Payer: 59 | Source: Ambulatory Visit | Attending: Orthopedic Surgery | Admitting: Orthopedic Surgery

## 2014-02-22 DIAGNOSIS — Z96659 Presence of unspecified artificial knee joint: Secondary | ICD-10-CM | POA: Insufficient documentation

## 2014-02-22 DIAGNOSIS — G4730 Sleep apnea, unspecified: Secondary | ICD-10-CM | POA: Insufficient documentation

## 2014-02-22 DIAGNOSIS — G5600 Carpal tunnel syndrome, unspecified upper limb: Secondary | ICD-10-CM | POA: Insufficient documentation

## 2014-02-22 DIAGNOSIS — F3289 Other specified depressive episodes: Secondary | ICD-10-CM | POA: Insufficient documentation

## 2014-02-22 DIAGNOSIS — J45909 Unspecified asthma, uncomplicated: Secondary | ICD-10-CM | POA: Insufficient documentation

## 2014-02-22 DIAGNOSIS — Z7982 Long term (current) use of aspirin: Secondary | ICD-10-CM | POA: Insufficient documentation

## 2014-02-22 DIAGNOSIS — I10 Essential (primary) hypertension: Secondary | ICD-10-CM | POA: Insufficient documentation

## 2014-02-22 DIAGNOSIS — F329 Major depressive disorder, single episode, unspecified: Secondary | ICD-10-CM | POA: Insufficient documentation

## 2014-02-22 DIAGNOSIS — H269 Unspecified cataract: Secondary | ICD-10-CM | POA: Insufficient documentation

## 2014-02-22 HISTORY — PX: CARPAL TUNNEL RELEASE: SHX101

## 2014-02-22 LAB — GLUCOSE, CAPILLARY
GLUCOSE-CAPILLARY: 117 mg/dL — AB (ref 70–99)
Glucose-Capillary: 107 mg/dL — ABNORMAL HIGH (ref 70–99)

## 2014-02-22 LAB — POCT HEMOGLOBIN-HEMACUE: HEMOGLOBIN: 13 g/dL (ref 12.0–15.0)

## 2014-02-22 SURGERY — CARPAL TUNNEL RELEASE
Anesthesia: Monitor Anesthesia Care | Site: Wrist | Laterality: Right

## 2014-02-22 MED ORDER — MIDAZOLAM HCL 2 MG/2ML IJ SOLN
INTRAMUSCULAR | Status: AC
  Filled 2014-02-22: qty 2

## 2014-02-22 MED ORDER — CHLORHEXIDINE GLUCONATE 4 % EX LIQD
60.0000 mL | Freq: Once | CUTANEOUS | Status: AC
  Administered 2014-02-22: 4 via TOPICAL

## 2014-02-22 MED ORDER — LIDOCAINE HCL (PF) 0.5 % IJ SOLN
INTRAMUSCULAR | Status: DC | PRN
  Administered 2014-02-22: 35 mL via INTRAVENOUS

## 2014-02-22 MED ORDER — METOCLOPRAMIDE HCL 5 MG/ML IJ SOLN
10.0000 mg | Freq: Once | INTRAMUSCULAR | Status: DC | PRN
Start: 2014-02-22 — End: 2014-02-22

## 2014-02-22 MED ORDER — FENTANYL CITRATE 0.05 MG/ML IJ SOLN: 50.0000 ug | INTRAMUSCULAR | Status: DC | PRN

## 2014-02-22 MED ORDER — OXYCODONE HCL 5 MG/5ML PO SOLN: 5.0000 mg | Freq: Once | ORAL | Status: AC | PRN

## 2014-02-22 MED ORDER — HYDROCODONE-ACETAMINOPHEN 5-325 MG PO TABS: ORAL_TABLET | ORAL | Status: DC

## 2014-02-22 MED ORDER — MIDAZOLAM HCL 5 MG/5ML IJ SOLN
INTRAMUSCULAR | Status: DC | PRN
  Administered 2014-02-22 (×2): 1 mg via INTRAVENOUS

## 2014-02-22 MED ORDER — FENTANYL CITRATE 0.05 MG/ML IJ SOLN
INTRAMUSCULAR | Status: DC | PRN
  Administered 2014-02-22 (×2): 50 ug via INTRAVENOUS

## 2014-02-22 MED ORDER — ONDANSETRON HCL 4 MG/2ML IJ SOLN
INTRAMUSCULAR | Status: DC | PRN
  Administered 2014-02-22: 4 mg via INTRAVENOUS

## 2014-02-22 MED ORDER — MIDAZOLAM HCL 2 MG/2ML IJ SOLN: 1.0000 mg | INTRAMUSCULAR | Status: DC | PRN

## 2014-02-22 MED ORDER — LACTATED RINGERS IV SOLN
INTRAVENOUS | Status: DC
  Administered 2014-02-22: 09:00:00 via INTRAVENOUS

## 2014-02-22 MED ORDER — OXYCODONE HCL 5 MG PO TABS
ORAL_TABLET | ORAL | Status: AC
  Filled 2014-02-22: qty 1

## 2014-02-22 MED ORDER — CEFAZOLIN SODIUM-DEXTROSE 2-3 GM-% IV SOLR
2.0000 g | INTRAVENOUS | Status: AC
  Administered 2014-02-22: 2 g via INTRAVENOUS

## 2014-02-22 MED ORDER — FENTANYL CITRATE 0.05 MG/ML IJ SOLN
INTRAMUSCULAR | Status: AC
  Filled 2014-02-22: qty 6

## 2014-02-22 MED ORDER — CEFAZOLIN SODIUM-DEXTROSE 2-3 GM-% IV SOLR
INTRAVENOUS | Status: AC
  Filled 2014-02-22: qty 50

## 2014-02-22 MED ORDER — FENTANYL CITRATE 0.05 MG/ML IJ SOLN: 25.0000 ug | INTRAMUSCULAR | Status: DC | PRN

## 2014-02-22 MED ORDER — BUPIVACAINE HCL (PF) 0.25 % IJ SOLN
INTRAMUSCULAR | Status: DC | PRN
  Administered 2014-02-22: 10 mL

## 2014-02-22 MED ORDER — OXYCODONE HCL 5 MG PO TABS
5.0000 mg | ORAL_TABLET | Freq: Once | ORAL | Status: AC | PRN
  Administered 2014-02-22: 5 mg via ORAL

## 2014-02-22 MED ORDER — PROPOFOL 10 MG/ML IV BOLUS
INTRAVENOUS | Status: DC | PRN
  Administered 2014-02-22: 20 mg via INTRAVENOUS

## 2014-02-22 SURGICAL SUPPLY — 35 items
BANDAGE ELASTIC 3 VELCRO ST LF (GAUZE/BANDAGES/DRESSINGS) ×2 IMPLANT
BLADE MINI RND TIP GREEN BEAV (BLADE) IMPLANT
BLADE SURG 15 STRL LF DISP TIS (BLADE) ×2 IMPLANT
BLADE SURG 15 STRL SS (BLADE) ×4
BNDG CMPR 9X4 STRL LF SNTH (GAUZE/BANDAGES/DRESSINGS) ×1
BNDG ESMARK 4X9 LF (GAUZE/BANDAGES/DRESSINGS) ×1 IMPLANT
BNDG GAUZE ELAST 4 BULKY (GAUZE/BANDAGES/DRESSINGS) ×2 IMPLANT
CHLORAPREP W/TINT 26ML (MISCELLANEOUS) ×2 IMPLANT
CORDS BIPOLAR (ELECTRODE) ×2 IMPLANT
COVER MAYO STAND STRL (DRAPES) ×2 IMPLANT
COVER TABLE BACK 60X90 (DRAPES) ×2 IMPLANT
CUFF TOURNIQUET SINGLE 18IN (TOURNIQUET CUFF) ×2 IMPLANT
DRAPE EXTREMITY T 121X128X90 (DRAPE) ×2 IMPLANT
DRAPE SURG 17X23 STRL (DRAPES) ×2 IMPLANT
DRSG PAD ABDOMINAL 8X10 ST (GAUZE/BANDAGES/DRESSINGS) ×2 IMPLANT
GAUZE XEROFORM 1X8 LF (GAUZE/BANDAGES/DRESSINGS) ×2 IMPLANT
GLOVE BIO SURGEON STRL SZ7.5 (GLOVE) ×4 IMPLANT
GLOVE BIOGEL PI IND STRL 8 (GLOVE) ×1 IMPLANT
GLOVE BIOGEL PI INDICATOR 8 (GLOVE) ×3
GOWN STRL REUS W/ TWL LRG LVL3 (GOWN DISPOSABLE) ×1 IMPLANT
GOWN STRL REUS W/TWL LRG LVL3 (GOWN DISPOSABLE)
GOWN STRL REUS W/TWL XL LVL3 (GOWN DISPOSABLE) ×3 IMPLANT
NDL HYPO 25X1 1.5 SAFETY (NEEDLE) IMPLANT
NEEDLE HYPO 25X1 1.5 SAFETY (NEEDLE) ×2 IMPLANT
NS IRRIG 1000ML POUR BTL (IV SOLUTION) ×2 IMPLANT
PACK BASIN DAY SURGERY FS (CUSTOM PROCEDURE TRAY) ×2 IMPLANT
PADDING CAST ABS 4INX4YD NS (CAST SUPPLIES) ×1
PADDING CAST ABS COTTON 4X4 ST (CAST SUPPLIES) ×1 IMPLANT
SPONGE GAUZE 4X4 12PLY (GAUZE/BANDAGES/DRESSINGS) ×2 IMPLANT
STOCKINETTE 4X48 STRL (DRAPES) ×2 IMPLANT
SUT ETHILON 4 0 PS 2 18 (SUTURE) ×2 IMPLANT
SYR BULB 3OZ (MISCELLANEOUS) ×2 IMPLANT
SYR CONTROL 10ML LL (SYRINGE) ×1 IMPLANT
TOWEL OR 17X24 6PK STRL BLUE (TOWEL DISPOSABLE) ×4 IMPLANT
UNDERPAD 30X30 INCONTINENT (UNDERPADS AND DIAPERS) ×2 IMPLANT

## 2014-02-22 NOTE — Brief Op Note (Signed)
02/22/2014  10:46 AM  PATIENT:  Trudie Reed  68 y.o. female  PRE-OPERATIVE DIAGNOSIS:  RIGHT CARPAL TUNNEL SYNDROME  POST-OPERATIVE DIAGNOSIS:  RIGHT CARPAL TUNNEL SYNDRONE  PROCEDURE:  Procedure(s): RIGHT CARPAL TUNNEL RELEASE (Right)  SURGEON:  Surgeon(s) and Role:    * Tennis Must, MD - Primary  PHYSICIAN ASSISTANT:   ASSISTANTS: none   ANESTHESIA:   Bier block with sedation  EBL:  Total I/O In: 550 [I.V.:550] Out: -   BLOOD ADMINISTERED:none  DRAINS: none   LOCAL MEDICATIONS USED:  MARCAINE     SPECIMEN:  No Specimen  DISPOSITION OF SPECIMEN:  N/A  COUNTS:  YES  TOURNIQUET:   Total Tourniquet Time Documented: Forearm (Right) - 40 minutes Total: Forearm (Right) - 40 minutes   DICTATION: .Other Dictation: Dictation Number [id number]  PLAN OF CARE: Discharge to home after PACU  PATIENT DISPOSITION:  PACU - hemodynamically stable.

## 2014-02-22 NOTE — H&P (Signed)
Kristina Perkins is an 68 y.o. female.   Chief Complaint: carpal tunnel syndrome HPI: 68 yo rhd female with pins and needles sensation in bilateral hands.  Splints have been somewhat helpful.  Nocturnal symptoms 2x/week.  She wishes to have a right carpal tunnel release for management of symptoms.  Past Medical History  Diagnosis Date  . Hypertension   . Asthma     hx of  . Cataracts, bilateral   . Pneumonia     no recent history of  . Depression   . Pneumothorax, traumatic 1974  . Traumatic injury of the kidney     38 years ago  . Multiple closed fractures of ribs of left side 1974  . Fracture, sternum closed 1974  . Fracture of left clavicle 1974  . SOB (shortness of breath) on exertion   . Diabetes mellitus     "I'm diabetic when I'm pregnant"  . Melanoma 1990    "on my back"  . Difficult intubation     "I am a terrible intubation; took over 45 min 11/03/11"  . Degenerative joint disease   . Osteoarthritis of knee     "some of my joints"  . DJD (degenerative joint disease)   . Sleep apnea     uses CPAP sleep study done at [facility]    Past Surgical History  Procedure Laterality Date  . Cesarean section  1975  . Shoulder arthroscopy  2006    left  . Adenoidectomy      2nd time  . Tonsillectomy and adenoidectomy      "as a child"  . Cholecystectomy  ~ 1999  . Total knee arthroplasty  11/04/11    left  . Total knee arthroplasty  11/03/2011    Procedure: TOTAL KNEE ARTHROPLASTY;  Surgeon: Kristina Dibble, MD;  Location: [place];  Service: Orthopedics;  Laterality: Left;  Primary left total knee with revision components   . Total knee arthroplasty  08/16/2012    Procedure: TOTAL KNEE ARTHROPLASTY;  Surgeon: Kristina Dibble, MD;  Location: [place];  Service: Orthopedics;  Laterality: Right;  Difficult Airway Awake Intubation per Anesthesia    Family History  Problem Relation Age of Onset  . Anesthesia problems Mother    Social History:  reports that she has never smoked.  She has never used smokeless tobacco. She reports that she drinks alcohol. She reports that she does not use illicit drugs.  Allergies:  Allergies  Allergen Reactions  . Sulfa Antibiotics Other (See Comments)    Blood in urine    Medications Prior to Admission  Medication Sig Dispense Refill  . acetaminophen (TYLENOL) 325 MG tablet Take 650 mg by mouth every 6 (six) hours as needed for mild pain (ususally at night).      . ALPRAZolam (XANAX) 0.25 MG tablet Take 0.25 mg by mouth 3 (three) times daily as needed. For anxiety      . hydrochlorothiazide (HYDRODIURIL) 25 MG tablet Take 25 mg by mouth daily as needed.      Marland Kitchen losartan-hydrochlorothiazide (HYZAAR) 50-12.5 MG per tablet Take 1 tablet by mouth daily.      . metFORMIN (GLUMETZA) 500 MG (MOD) 24 hr tablet Take 1,000 mg by mouth daily after supper.      . zolpidem (AMBIEN) 10 MG tablet Take 10 mg by mouth at bedtime as needed. For sleep      . aspirin EC 325 MG EC tablet Take 1 tablet (325 mg total) by mouth 2 (  two) times daily.  30 tablet  1    Results for orders placed during the hospital encounter of 02/22/14 (from the past 48 hour(s))  BASIC METABOLIC PANEL     Status: Abnormal   Collection Time    02/20/14  1:00 PM      Result Value Ref Range   Sodium 143  137 - 147 mEq/L   Potassium 4.2  3.7 - 5.3 mEq/L   Chloride 101  96 - 112 mEq/L   CO2 26  19 - 32 mEq/L   Glucose, Bld 116 (*) 70 - 99 mg/dL   BUN 16  6 - 23 mg/dL   Creatinine, Ser 0.61  0.50 - 1.10 mg/dL   Calcium 9.2  8.4 - 10.5 mg/dL   GFR calc non Af Amer >90  >90 mL/min   GFR calc Af Amer >90  >90 mL/min   Comment: (NOTE)     The eGFR has been calculated using the CKD EPI equation.     This calculation has not been validated in all clinical situations.     eGFR's persistently <90 mL/min signify possible Chronic Kidney     Disease.  GLUCOSE, CAPILLARY     Status: Abnormal   Collection Time    02/22/14  8:37 AM      Result Value Ref Range   Glucose-Capillary  117 (*) 70 - 99 mg/dL  POCT HEMOGLOBIN-HEMACUE     Status: None   Collection Time    02/22/14  8:38 AM      Result Value Ref Range   Hemoglobin 13.0  12.0 - 15.0 g/dL    No results found.   A comprehensive review of systems was negative except for: Eyes: positive for cataracts and contacts/glasses  Blood pressure 114/75, pulse 71, temperature 98 F (36.7 C), temperature source Oral, resp. rate 20, height '5\' 6"'  (1.676 m), weight 111.585 kg (246 lb), SpO2 96.00%.  General appearance: alert, cooperative and appears stated age Head: Normocephalic, without obvious abnormality, atraumatic Neck: supple, symmetrical, trachea midline Resp: clear to auscultation bilaterally Cardio: regular rate and rhythm GI: non tender Extremities: intact capillary refill all fingertips.  fingertips feel different.  +epl/fpl/io.  no wounds. Pulses: 2+ and symmetric Skin: Skin color, texture, turgor normal. No rashes or lesions Neurologic: Grossly normal Incision/Wound: none  Assessment/Plan Bilateral carpal tunnel syndrome.  Non operative and operative treatment options were discussed with the patient and patient wishes to proceed with operative treatment. Risks, benefits, and alternatives of surgery were discussed and the patient agrees with the plan of care.   Avice Funchess R 02/22/2014, 8:44 AM

## 2014-02-22 NOTE — Op Note (Signed)
NAMECLOMA, RAHRIG NO.:  [id number]  MEDICAL RECORD NO.:  [id number]  LOCATION:                                 FACILITY:  PHYSICIAN:  Leanora Cover, MD        DATE OF BIRTH:  24-Jul-1946  DATE OF PROCEDURE:  02/22/2014 DATE OF DISCHARGE:                              OPERATIVE REPORT   PREOPERATIVE DIAGNOSIS:  Right carpal tunnel syndrome.  POSTOPERATIVE DIAGNOSIS:  Right carpal tunnel syndrome.  PROCEDURE:  Right carpal tunnel release.  SURGEON:  Leanora Cover, MD  ASSISTANT:  None.  ANESTHESIA:  Bier block with sedation.  IV FLUIDS:  Per anesthesia flow sheet.  ESTIMATED BLOOD LOSS:  Minimal.  COMPLICATIONS:  None.  SPECIMENS:  None.  TOURNIQUET TIME:  40 minutes.  DISPOSITION:  Stable to PACU.  INDICATIONS:  Ms. Kristina Perkins is a 68 year old female who has had pins and needle sensation in the fingertips.  She has nocturnal symptoms that are bothersome to her.  She has positive nerve conduction studies.  She wished to have a carpal tunnel release for management of symptoms. Risks, benefits and alternatives of surgery were discussed including risk of blood loss, infection, damage to nerves, vessels, tendons, ligaments, bone; failure of surgery; need for additional surgery, complications with wound healing, continued pain, and recurrent carpal tunnel syndrome.  She voiced understanding of these risks and elected to proceed.  OPERATIVE COURSE:  After being identified preoperatively by myself, the patient and I agreed upon procedure and site of procedure.  Surgical site was marked.  The risks, benefits, and alternatives of surgery were reviewed and she wished to proceed.  Surgical consent had been signed. She was given IV Ancef as preoperative antibiotic prophylaxis.  She was transferred to the operating room and placed on the operating room table in supine position with the right upper extremity on arm board.  Bier block anesthesia was induced by  anesthesiologist.  The right upper extremity was prepped and draped in normal sterile orthopedic fashion. Surgical pause was performed between surgeons, anesthesia, and operating room staff, and all were in agreement as to the patient, procedure, and site of procedure.  Tourniquet had been inflated for the Bier block.  An incision was made over the transverse carpal ligament.  It was carried into subcutaneous tissues by spreading technique.  Bipolar electrocautery was used to obtain hemostasis.  The palmar fascia was incised.  The transverse carpal ligament was identified.  It was incised sharply in its distal direction.  Care was taken to ensure complete decompression distally.  It was then incised proximally.  Scissors were used to split the distal aspect of the volar antebrachial fascia.  The finger was placed into the wound to ensure complete decompression. There was a thick band of tissue noted.  Retractor was placed back in the wound and an accessory ligament was visualized.  It was very thick. It was released sharply with the scissors.  Care was taken to protect the median nerve during this.  The finger was placed back into the wound and another tight band of volar antebrachial fascia was noted.  The skin incision was extended proximally across the wrist  crease with an angle of the crease.  This was again carried into subcutaneous tissues with spreading technique.  Bipolar electrocautery was used to obtain hemostasis.  The volar antebrachial fascia was identified and sharply incised.  The median nerve was identified and was intact and protected. The nerve was then inspected at the level of the carpal tunnel.  It was flattened and hyperemic.  It was adherent to the radial leaflet.  It was freed up.  The motor branch was identified and was intact.  The wound was copiously irrigated with sterile saline.  It was closed with 4-0 nylon in a horizontal mattress fashion.  It was then  injected with 10 mL of 0.25% plain Marcaine to aid in postoperative analgesia.  It was dressed with sterile Xeroform, 4x4s, and ABD and wrapped with Kerlix and Ace bandage.  The tourniquet was deflated at 40 minutes.  Fingertips were pink with brisk capillary refill after deflation of the tourniquet. Operative drapes were broken down.  The patient was awoken from anesthesia safely.  She was transferred back to the stretcher and taken to PACU in stable condition.  I will see her back in the office in 1 week for postoperative followup.  I will give her Norco 5/325, 1-2 p.o. q.6 hours p.r.n. pain, dispensed #30.     Leanora Cover, MD     KK/MEDQ  D:  02/22/2014  T:  02/22/2014  Job:  [id number]

## 2014-02-22 NOTE — Anesthesia Procedure Notes (Signed)
Procedure Name: MAC Date/Time: 02/22/2014 9:58 AM Performed by: Melynda Ripple D Pre-anesthesia Checklist: Patient identified, Timeout performed, Emergency Drugs available, Suction available and Patient being monitored Patient Re-evaluated:Patient Re-evaluated prior to inductionOxygen Delivery Method: Simple face mask

## 2014-02-22 NOTE — Discharge Instructions (Addendum)

## 2014-02-22 NOTE — Anesthesia Preprocedure Evaluation (Signed)
Anesthesia Evaluation  Patient identified by MRN, date of birth, ID band Patient awake    Reviewed: Allergy & Precautions, H&P , NPO status , Patient's Chart, lab work & pertinent test results, reviewed documented beta blocker date and time   History of Anesthesia Complications (+) DIFFICULT AIRWAY and history of anesthetic complications  Airway Mallampati: III TM Distance: <3 FB Neck ROM: limited  Mouth opening: Limited Mouth Opening  Dental   Pulmonary shortness of breath, asthma , sleep apnea and Continuous Positive Airway Pressure Ventilation , pneumonia -, resolved,  breath sounds clear to auscultation        Cardiovascular hypertension, On Medications Rhythm:regular     Neuro/Psych PSYCHIATRIC DISORDERS Depression negative neurological ROS     GI/Hepatic negative GI ROS, Neg liver ROS,   Endo/Other  diabetes, Oral Hypoglycemic AgentsMorbid obesity  Renal/GU Renal disease  negative genitourinary   Musculoskeletal   Abdominal   Peds  Hematology negative hematology ROS (+)   Anesthesia Other Findings See surgeon's H&P   Reproductive/Obstetrics negative OB ROS                           Anesthesia Physical Anesthesia Plan  ASA: II  Anesthesia Plan: MAC and Bier Block   Post-op Pain Management:    Induction:   Airway Management Planned: Simple Face Mask  Additional Equipment:   Intra-op Plan:   Post-operative Plan:   Informed Consent: I have reviewed the patients History and Physical, chart, labs and discussed the procedure including the risks, benefits and alternatives for the proposed anesthesia with the patient or authorized representative who has indicated his/her understanding and acceptance.   Dental Advisory Given  Plan Discussed with: CRNA and Surgeon  Anesthesia Plan Comments:         Anesthesia Quick Evaluation

## 2014-02-22 NOTE — Op Note (Signed)
[id number] 

## 2014-02-22 NOTE — Anesthesia Postprocedure Evaluation (Signed)
Anesthesia Post Note  Patient: Kristina Perkins  Procedure(s) Performed: Procedure(s) (LRB): RIGHT CARPAL TUNNEL RELEASE (Right)  Anesthesia type: MAC  Patient location: PACU  Post pain: Pain level controlled  Post assessment: Patient's Cardiovascular Status Stable  Last Vitals:  Filed Vitals:   02/22/14 1100  BP: 131/62  Pulse: 69  Temp:   Resp: 19    Post vital signs: Reviewed and stable  Level of consciousness: alert  Complications: No apparent anesthesia complications

## 2014-02-22 NOTE — Transfer of Care (Signed)
Immediate Anesthesia Transfer of Care Note  Patient: Kristina Perkins  Procedure(s) Performed: Procedure(s): RIGHT CARPAL TUNNEL RELEASE (Right)  Patient Location: PACU  Anesthesia Type:MAC and Bier block  Level of Consciousness: awake, alert  and oriented  Airway & Oxygen Therapy: Patient Spontanous Breathing  Post-op Assessment: Report given to PACU RN and Post -op Vital signs reviewed and stable  Post vital signs: Reviewed and stable  Complications: No apparent anesthesia complications

## 2014-02-27 ENCOUNTER — Encounter (HOSPITAL_BASED_OUTPATIENT_CLINIC_OR_DEPARTMENT_OTHER): Payer: Self-pay | Admitting: Orthopedic Surgery

## 2014-03-23 ENCOUNTER — Ambulatory Visit: Payer: 59 | Admitting: *Deleted

## 2014-05-22 ENCOUNTER — Other Ambulatory Visit: Payer: Self-pay

## 2014-05-22 ENCOUNTER — Ambulatory Visit
Admission: RE | Admit: 2014-05-22 | Discharge: 2014-05-22 | Disposition: A | Discharge status: Home / Self Care | Payer: 59 | Source: Ambulatory Visit

## 2014-05-22 DIAGNOSIS — Z1231 Encounter for screening mammogram for malignant neoplasm of breast: Secondary | ICD-10-CM

## 2014-07-19 ENCOUNTER — Other Ambulatory Visit: Payer: Self-pay | Admitting: Orthopedic Surgery

## 2014-07-24 ENCOUNTER — Encounter (HOSPITAL_BASED_OUTPATIENT_CLINIC_OR_DEPARTMENT_OTHER): Payer: Self-pay | Admitting: *Deleted

## 2014-07-24 NOTE — Progress Notes (Signed)
Here 4/15 for rt ctr-did well-will bring cpap -will use post op- Had recent labs for work-will bring-will need istat if not done

## 2014-07-25 ENCOUNTER — Encounter (HOSPITAL_BASED_OUTPATIENT_CLINIC_OR_DEPARTMENT_OTHER)
Admission: RE | Admit: 2014-07-25 | Discharge: 2014-07-25 | Disposition: A | Discharge status: Home / Self Care | Payer: 59 | Source: Ambulatory Visit | Attending: Orthopedic Surgery | Admitting: Orthopedic Surgery

## 2014-07-25 DIAGNOSIS — Z85820 Personal history of malignant melanoma of skin: Secondary | ICD-10-CM | POA: Diagnosis not present

## 2014-07-25 DIAGNOSIS — J45909 Unspecified asthma, uncomplicated: Secondary | ICD-10-CM | POA: Diagnosis not present

## 2014-07-25 DIAGNOSIS — F329 Major depressive disorder, single episode, unspecified: Secondary | ICD-10-CM | POA: Diagnosis not present

## 2014-07-25 DIAGNOSIS — Z96659 Presence of unspecified artificial knee joint: Secondary | ICD-10-CM | POA: Diagnosis not present

## 2014-07-25 DIAGNOSIS — F3289 Other specified depressive episodes: Secondary | ICD-10-CM | POA: Diagnosis not present

## 2014-07-25 DIAGNOSIS — I10 Essential (primary) hypertension: Secondary | ICD-10-CM | POA: Diagnosis not present

## 2014-07-25 DIAGNOSIS — G4730 Sleep apnea, unspecified: Secondary | ICD-10-CM | POA: Diagnosis not present

## 2014-07-25 DIAGNOSIS — G5600 Carpal tunnel syndrome, unspecified upper limb: Secondary | ICD-10-CM | POA: Diagnosis not present

## 2014-07-25 DIAGNOSIS — E119 Type 2 diabetes mellitus without complications: Secondary | ICD-10-CM | POA: Diagnosis not present

## 2014-07-25 LAB — BASIC METABOLIC PANEL
ANION GAP: 12 (ref 5–15)
BUN: 21 mg/dL (ref 6–23)
CO2: 29 mEq/L (ref 19–32)
Calcium: 9.6 mg/dL (ref 8.4–10.5)
Chloride: 101 mEq/L (ref 96–112)
Creatinine, Ser: 0.7 mg/dL (ref 0.50–1.10)
GFR, EST NON AFRICAN AMERICAN: 87 mL/min — AB (ref >90)
Glucose, Bld: 104 mg/dL — ABNORMAL HIGH (ref 70–99)
POTASSIUM: 4 meq/L (ref 3.7–5.3)
Sodium: 142 mEq/L (ref 137–147)

## 2014-07-26 ENCOUNTER — Ambulatory Visit (HOSPITAL_BASED_OUTPATIENT_CLINIC_OR_DEPARTMENT_OTHER)
Admission: RE | Admit: 2014-07-26 | Discharge: 2014-07-26 | Disposition: A | Discharge status: Home / Self Care | Payer: 59 | Source: Ambulatory Visit | Attending: Orthopedic Surgery | Admitting: Orthopedic Surgery

## 2014-07-26 ENCOUNTER — Encounter (HOSPITAL_BASED_OUTPATIENT_CLINIC_OR_DEPARTMENT_OTHER): Payer: 59 | Admitting: Certified Registered"

## 2014-07-26 ENCOUNTER — Encounter (HOSPITAL_BASED_OUTPATIENT_CLINIC_OR_DEPARTMENT_OTHER)
Admission: RE | Disposition: A | Discharge status: Home / Self Care | Payer: Self-pay | Source: Ambulatory Visit | Attending: Orthopedic Surgery

## 2014-07-26 ENCOUNTER — Encounter (HOSPITAL_BASED_OUTPATIENT_CLINIC_OR_DEPARTMENT_OTHER): Payer: Self-pay | Admitting: Certified Registered"

## 2014-07-26 ENCOUNTER — Ambulatory Visit (HOSPITAL_BASED_OUTPATIENT_CLINIC_OR_DEPARTMENT_OTHER): Payer: 59 | Admitting: Certified Registered"

## 2014-07-26 DIAGNOSIS — G5600 Carpal tunnel syndrome, unspecified upper limb: Secondary | ICD-10-CM | POA: Diagnosis not present

## 2014-07-26 DIAGNOSIS — I10 Essential (primary) hypertension: Secondary | ICD-10-CM | POA: Insufficient documentation

## 2014-07-26 DIAGNOSIS — J45909 Unspecified asthma, uncomplicated: Secondary | ICD-10-CM | POA: Insufficient documentation

## 2014-07-26 DIAGNOSIS — E119 Type 2 diabetes mellitus without complications: Secondary | ICD-10-CM | POA: Insufficient documentation

## 2014-07-26 DIAGNOSIS — Z85820 Personal history of malignant melanoma of skin: Secondary | ICD-10-CM | POA: Insufficient documentation

## 2014-07-26 DIAGNOSIS — F3289 Other specified depressive episodes: Secondary | ICD-10-CM | POA: Insufficient documentation

## 2014-07-26 DIAGNOSIS — G4730 Sleep apnea, unspecified: Secondary | ICD-10-CM | POA: Insufficient documentation

## 2014-07-26 DIAGNOSIS — Z96659 Presence of unspecified artificial knee joint: Secondary | ICD-10-CM | POA: Insufficient documentation

## 2014-07-26 DIAGNOSIS — F329 Major depressive disorder, single episode, unspecified: Secondary | ICD-10-CM | POA: Insufficient documentation

## 2014-07-26 HISTORY — PX: CARPAL TUNNEL RELEASE: SHX101

## 2014-07-26 LAB — POCT HEMOGLOBIN-HEMACUE: Hemoglobin: 13.8 g/dL (ref 12.0–15.0)

## 2014-07-26 LAB — GLUCOSE, CAPILLARY
GLUCOSE-CAPILLARY: 119 mg/dL — AB (ref 70–99)
Glucose-Capillary: 120 mg/dL — ABNORMAL HIGH (ref 70–99)

## 2014-07-26 SURGERY — CARPAL TUNNEL RELEASE
Anesthesia: Monitor Anesthesia Care | Site: Wrist | Laterality: Left

## 2014-07-26 MED ORDER — BUPIVACAINE HCL (PF) 0.25 % IJ SOLN
INTRAMUSCULAR | Status: DC | PRN
  Administered 2014-07-26: 7 mL

## 2014-07-26 MED ORDER — FENTANYL CITRATE 0.05 MG/ML IJ SOLN
INTRAMUSCULAR | Status: AC
  Filled 2014-07-26: qty 4

## 2014-07-26 MED ORDER — PROMETHAZINE HCL 25 MG/ML IJ SOLN: 6.2500 mg | INTRAMUSCULAR | Status: DC | PRN

## 2014-07-26 MED ORDER — CHLORHEXIDINE GLUCONATE 4 % EX LIQD: 60.0000 mL | Freq: Once | CUTANEOUS | Status: DC

## 2014-07-26 MED ORDER — FENTANYL CITRATE 0.05 MG/ML IJ SOLN
INTRAMUSCULAR | Status: DC | PRN
  Administered 2014-07-26: 25 ug via INTRAVENOUS
  Administered 2014-07-26: 50 ug via INTRAVENOUS
  Administered 2014-07-26: 25 ug via INTRAVENOUS

## 2014-07-26 MED ORDER — ONDANSETRON HCL 4 MG/2ML IJ SOLN
INTRAMUSCULAR | Status: DC | PRN
  Administered 2014-07-26: 4 mg via INTRAVENOUS

## 2014-07-26 MED ORDER — PROPOFOL 10 MG/ML IV BOLUS
INTRAVENOUS | Status: DC | PRN
  Administered 2014-07-26: 20 mg via INTRAVENOUS

## 2014-07-26 MED ORDER — MIDAZOLAM HCL 2 MG/2ML IJ SOLN
INTRAMUSCULAR | Status: AC
  Filled 2014-07-26: qty 2

## 2014-07-26 MED ORDER — HYDROCODONE-ACETAMINOPHEN 5-325 MG PO TABS: ORAL_TABLET | ORAL | Status: DC

## 2014-07-26 MED ORDER — CEFAZOLIN SODIUM-DEXTROSE 2-3 GM-% IV SOLR
2.0000 g | INTRAVENOUS | Status: AC
  Administered 2014-07-26: 2 g via INTRAVENOUS

## 2014-07-26 MED ORDER — LIDOCAINE HCL (PF) 0.5 % IJ SOLN
INTRAMUSCULAR | Status: DC | PRN
  Administered 2014-07-26: 30 mL via INTRAVENOUS

## 2014-07-26 MED ORDER — CEFAZOLIN SODIUM-DEXTROSE 2-3 GM-% IV SOLR
INTRAVENOUS | Status: AC
  Filled 2014-07-26: qty 50

## 2014-07-26 MED ORDER — BUPIVACAINE HCL (PF) 0.25 % IJ SOLN
INTRAMUSCULAR | Status: AC
  Filled 2014-07-26: qty 30

## 2014-07-26 MED ORDER — FENTANYL CITRATE 0.05 MG/ML IJ SOLN: 25.0000 ug | INTRAMUSCULAR | Status: DC | PRN

## 2014-07-26 MED ORDER — LACTATED RINGERS IV SOLN
INTRAVENOUS | Status: DC
  Administered 2014-07-26: 10:00:00 via INTRAVENOUS

## 2014-07-26 MED ORDER — MIDAZOLAM HCL 5 MG/5ML IJ SOLN
INTRAMUSCULAR | Status: DC | PRN
  Administered 2014-07-26: 1 mg via INTRAVENOUS

## 2014-07-26 MED ORDER — OXYCODONE HCL 5 MG PO TABS
ORAL_TABLET | ORAL | Status: AC
  Filled 2014-07-26: qty 1

## 2014-07-26 MED ORDER — MIDAZOLAM HCL 2 MG/2ML IJ SOLN: 1.0000 mg | INTRAMUSCULAR | Status: DC | PRN

## 2014-07-26 MED ORDER — OXYCODONE HCL 5 MG PO TABS
5.0000 mg | ORAL_TABLET | Freq: Once | ORAL | Status: AC | PRN
  Administered 2014-07-26: 5 mg via ORAL

## 2014-07-26 MED ORDER — OXYCODONE HCL 5 MG/5ML PO SOLN: 5.0000 mg | Freq: Once | ORAL | Status: AC | PRN

## 2014-07-26 MED ORDER — FENTANYL CITRATE 0.05 MG/ML IJ SOLN: 50.0000 ug | INTRAMUSCULAR | Status: DC | PRN

## 2014-07-26 SURGICAL SUPPLY — 38 items
BANDAGE ELASTIC 3 VELCRO ST LF (GAUZE/BANDAGES/DRESSINGS) ×2 IMPLANT
BLADE MINI RND TIP GREEN BEAV (BLADE) IMPLANT
BLADE SURG 15 STRL LF DISP TIS (BLADE) ×2 IMPLANT
BLADE SURG 15 STRL SS (BLADE) ×4
BNDG CMPR 9X4 STRL LF SNTH (GAUZE/BANDAGES/DRESSINGS)
BNDG ESMARK 4X9 LF (GAUZE/BANDAGES/DRESSINGS) IMPLANT
BNDG GAUZE ELAST 4 BULKY (GAUZE/BANDAGES/DRESSINGS) ×2 IMPLANT
CHLORAPREP W/TINT 26ML (MISCELLANEOUS) ×2 IMPLANT
CORDS BIPOLAR (ELECTRODE) ×2 IMPLANT
COVER MAYO STAND STRL (DRAPES) ×2 IMPLANT
COVER TABLE BACK 60X90 (DRAPES) ×2 IMPLANT
CUFF TOURNIQUET SINGLE 18IN (TOURNIQUET CUFF) ×2 IMPLANT
DRAPE EXTREMITY T 121X128X90 (DRAPE) ×2 IMPLANT
DRAPE SURG 17X23 STRL (DRAPES) ×2 IMPLANT
DRSG PAD ABDOMINAL 8X10 ST (GAUZE/BANDAGES/DRESSINGS) ×2 IMPLANT
GAUZE SPONGE 4X4 12PLY STRL (GAUZE/BANDAGES/DRESSINGS) ×2 IMPLANT
GAUZE XEROFORM 1X8 LF (GAUZE/BANDAGES/DRESSINGS) ×2 IMPLANT
GLOVE BIO SURGEON STRL SZ 6.5 (GLOVE) ×1 IMPLANT
GLOVE BIO SURGEON STRL SZ7.5 (GLOVE) ×2 IMPLANT
GLOVE BIOGEL PI IND STRL 7.0 (GLOVE) IMPLANT
GLOVE BIOGEL PI IND STRL 8 (GLOVE) ×1 IMPLANT
GLOVE BIOGEL PI INDICATOR 7.0 (GLOVE) ×2
GLOVE BIOGEL PI INDICATOR 8 (GLOVE) ×1
GOWN STRL REUS W/ TWL LRG LVL3 (GOWN DISPOSABLE) ×1 IMPLANT
GOWN STRL REUS W/TWL LRG LVL3 (GOWN DISPOSABLE) ×2
GOWN STRL REUS W/TWL XL LVL3 (GOWN DISPOSABLE) ×2 IMPLANT
NDL HYPO 25X1 1.5 SAFETY (NEEDLE) IMPLANT
NEEDLE HYPO 25X1 1.5 SAFETY (NEEDLE) IMPLANT
NS IRRIG 1000ML POUR BTL (IV SOLUTION) ×2 IMPLANT
PACK BASIN DAY SURGERY FS (CUSTOM PROCEDURE TRAY) ×2 IMPLANT
PADDING CAST ABS 4INX4YD NS (CAST SUPPLIES) ×1
PADDING CAST ABS COTTON 4X4 ST (CAST SUPPLIES) ×1 IMPLANT
STOCKINETTE 4X48 STRL (DRAPES) ×2 IMPLANT
SUT ETHILON 4 0 PS 2 18 (SUTURE) ×2 IMPLANT
SYR BULB 3OZ (MISCELLANEOUS) ×2 IMPLANT
SYR CONTROL 10ML LL (SYRINGE) ×1 IMPLANT
TOWEL OR 17X24 6PK STRL BLUE (TOWEL DISPOSABLE) ×3 IMPLANT
UNDERPAD 30X30 INCONTINENT (UNDERPADS AND DIAPERS) ×2 IMPLANT

## 2014-07-26 NOTE — Anesthesia Postprocedure Evaluation (Signed)
  Anesthesia Post-op Note  Patient: Kristina Perkins  Procedure(s) Performed: Procedure(s): LEFT CARPAL TUNNEL RELEASE (Left)  Patient Location: PACU  Anesthesia Type:Bier block  Level of Consciousness: awake, alert , oriented and patient cooperative  Airway and Oxygen Therapy: Patient Spontanous Breathing  Post-op Pain: none  Post-op Assessment: Post-op Vital signs reviewed, Patient's Cardiovascular Status Stable, Respiratory Function Stable, Patent Airway, No signs of Nausea or vomiting and Pain level controlled  Post-op Vital Signs: Reviewed and stable  Last Vitals:  Filed Vitals:   07/26/14 1147  BP:   Pulse: 64  Temp: 36.6 C  Resp: 17    Complications: No apparent anesthesia complications

## 2014-07-26 NOTE — Anesthesia Procedure Notes (Signed)
Procedure Name: MAC Date/Time: 07/26/2014 11:04 AM Performed by: Baxter Flattery Pre-anesthesia Checklist: Patient identified, Emergency Drugs available, Suction available and Patient being monitored Patient Re-evaluated:Patient Re-evaluated prior to inductionOxygen Delivery Method: Simple face mask Preoxygenation: Pre-oxygenation with 100% oxygen Dental Injury: Teeth and Oropharynx as per pre-operative assessment

## 2014-07-26 NOTE — Brief Op Note (Signed)
07/26/2014  11:41 AM  PATIENT:  Kristina Perkins  68 y.o. female  PRE-OPERATIVE DIAGNOSIS:  LEFT carpal tunnel syndrome  POST-OPERATIVE DIAGNOSIS:  left carpal tunnel syndrome  PROCEDURE:  Procedure(s): LEFT CARPAL TUNNEL RELEASE (Left)  SURGEON:  Surgeon(s) and Role:    * Leanora Cover, MD - Primary  PHYSICIAN ASSISTANT:   ASSISTANTS: none   ANESTHESIA:   Bier block with sedation  EBL:  Total I/O In: 100 [I.V.:100] Out: -   BLOOD ADMINISTERED:none  DRAINS: none   LOCAL MEDICATIONS USED:  MARCAINE     SPECIMEN:  No Specimen  DISPOSITION OF SPECIMEN:  N/A  COUNTS:  YES  TOURNIQUET:  Left forearm 28 minutes at 275 mmHg  DICTATION: .Other Dictation: Dictation Number [id number]  PLAN OF CARE: Discharge to home after PACU  PATIENT DISPOSITION:  PACU - hemodynamically stable.

## 2014-07-26 NOTE — Op Note (Signed)
[id number] 

## 2014-07-26 NOTE — Discharge Instructions (Addendum)

## 2014-07-26 NOTE — Anesthesia Preprocedure Evaluation (Addendum)
Anesthesia Evaluation  Patient identified by MRN, date of birth, ID band Patient awake    History of Anesthesia Complications (+) DIFFICULT AIRWAY  Airway       Dental   Pulmonary shortness of breath, asthma , sleep apnea ,          Cardiovascular hypertension,     Neuro/Psych    GI/Hepatic negative GI ROS, Neg liver ROS,   Endo/Other  diabetes  Renal/GU      Musculoskeletal   Abdominal   Peds  Hematology   Anesthesia Other Findings   Reproductive/Obstetrics                          Anesthesia Physical Anesthesia Plan  ASA: III  Anesthesia Plan: MAC and Bier Block   Post-op Pain Management:    Induction: Intravenous  Airway Management Planned: Natural Airway and Simple Face Mask  Additional Equipment:   Intra-op Plan:   Post-operative Plan: Extubation in OR  Informed Consent: I have reviewed the patients History and Physical, chart, labs and discussed the procedure including the risks, benefits and alternatives for the proposed anesthesia with the patient or authorized representative who has indicated his/her understanding and acceptance.   Dental advisory given  Plan Discussed with:   Anesthesia Plan Comments:         Anesthesia Quick Evaluation

## 2014-07-26 NOTE — Op Note (Signed)
Kristina Perkins, Kristina Perkins                ACCOUNT NO.:  [id number]  MEDICAL RECORD NO.:  [id number]  LOCATION:                                 FACILITY:  PHYSICIAN:  Leanora Cover, MD        DATE OF BIRTH:  04/15/1946  DATE OF PROCEDURE:  07/26/2014 DATE OF DISCHARGE:                              OPERATIVE REPORT   PREOPERATIVE DIAGNOSIS:  Left carpal tunnel syndrome.  POSTOPERATIVE DIAGNOSIS:  Left carpal tunnel syndrome.  PROCEDURE:  Left carpal tunnel release.  SURGEON:  Leanora Cover, MD.  ASSISTANT:  None.  ANESTHESIA:  Bier block with sedation.  IV FLUIDS:  Per anesthesia flow sheet.  ESTIMATED BLOOD LOSS:  Minimal.  COMPLICATIONS:  None.  SPECIMENS:  None.  TOURNIQUET TIME:  28 minutes.  DISPOSITION:  Stable to PACU.  INDICATIONS:  Ms. Sarver is a 68 year old female who has pins and needles sensation in the left hand.  She has positive nerve conduction studies. She has nocturnal symptoms that wake and her causes to shake her hand to get relief.  She has had a right carpal tunnel release already.  She wished to have a left carpal tunnel release.  Risks benefits alternatives surgery were discussed including risk of blood loss, infection, damage to nerves, vessels, tendons, ligaments, bone, failure of surgery, need for additional surgery, complications with wound healing, continued pain, continued numbness, and recurrent carpal tunnel syndrome.  She voiced understanding of these risks and elected to proceed.  OPERATIVE COURSE:  After being identified preoperatively by myself, the patient and I agreed upon procedure and site procedure.  Surgical site was marked.  The risks, benefits, and alternatives of surgery were reviewed and she wished to proceed.  Surgical consent had been signed. She was given IV Ancef as preoperative antibiotic prophylaxis.  She was transferred to the operating room and placed on the operating table in supine position with left upper extremity on  arm board.  Bier block anesthesia was induced by Anesthesiology.  Left upper extremity was prepped and draped in normal sterile orthopedic fashion.  Surgical pause performed between surgeons, anesthesia, operating staff, and all were in agreement as to the patient, procedure, and site procedure.  The tourniquet had been inflated for the Bier block.  An incision was made over the transverse carpal ligament and carried into subcutaneous tissues by spreading technique.  Bipolar electrocautery was used to obtain hemostasis.  The palmar fascia was sharply incised.  The transverse carpal ligament was identified.  We  sharply incised.  It was incised distally first.  Care was taken to ensure complete decompression distally.  It was then incised proximally.  Scissors were used to split the distal aspect of the volar antebrachial fascia.  Finger was placed into the wound to ensure complete decompression which was the case.  The nerve was inspected.  It is flattened and hyperemic.  The motor branch was identified and was intact.  The wound was copiously irrigated with sterile saline.  It was closed with 4-0 nylon in horizontal mattress fashion.  It was injected with 7 mL of 0.25% plain Marcaine to aid in postoperative analgesia.  It was dressed with sterile  Xeroform, 4x4s, ABD, and wrapped with Kerlix and Ace bandage.  Tourniquet deflated at 28 minutes.  The fingertips were pink with brisk capillary refill after deflation of tourniquet.  The operative drapes were broken down, and the patient was awoken from anesthesia safely.  She was transferred back to stretcher and taken to PACU in stable condition.  I will see her back in the office in 1 week for postoperative followup.  I will give her Norco 5/325, 1-2 p.o. q.6 hours p.r.n. pain, dispensed #30.     Leanora Cover, MD     KK/MEDQ  D:  07/26/2014  T:  07/26/2014  Job:  [id number]

## 2014-07-26 NOTE — Transfer of Care (Signed)
Immediate Anesthesia Transfer of Care Note  Patient: Kristina Perkins  Procedure(s) Performed: Procedure(s): LEFT CARPAL TUNNEL RELEASE (Left)  Patient Location: PACU  Anesthesia Type:MAC  Level of Consciousness: awake, alert  and oriented  Airway & Oxygen Therapy: Patient Spontanous Breathing  Post-op Assessment: Report given to PACU RN and Post -op Vital signs reviewed and stable  Post vital signs: Reviewed and stable  Complications: No apparent anesthesia complications

## 2014-07-26 NOTE — H&P (Signed)
Kristina Perkins is an 68 y.o. female.   Chief Complaint: carpal tunnel syndrome  HPI: 68 yo rhd female with pins and needles sensation in left hand. Splints have been somewhat helpful. Nocturnal symptoms 2x/week. She has had a right carpal tunnel release.  She wishes to have a right carpal tunnel release for management of symptoms.   Past Medical History  Diagnosis Date  . Hypertension   . Asthma     hx of  . Cataracts, bilateral   . Pneumonia     no recent history of  . Depression   . Pneumothorax, traumatic 1974  . Traumatic injury of the kidney     38 years ago  . Multiple closed fractures of ribs of left side 1974  . Fracture, sternum closed 1974  . Fracture of left clavicle 1974  . SOB (shortness of breath) on exertion   . Diabetes mellitus     "I'm diabetic when I'm pregnant"  . Melanoma 1990    "on my back"  . Difficult intubation     "I am a terrible intubation; took over 45 min 11/03/11"  . Degenerative joint disease   . Osteoarthritis of knee     "some of my joints"  . DJD (degenerative joint disease)   . Sleep apnea     uses CPAP sleep study done at [facility]    Past Surgical History  Procedure Laterality Date  . Cesarean section  1975  . Shoulder arthroscopy  2006    left  . Adenoidectomy      2nd time  . Tonsillectomy and adenoidectomy      "as a child"  . Cholecystectomy  ~ 1999  . Total knee arthroplasty  11/04/11    left  . Total knee arthroplasty  11/03/2011    Procedure: TOTAL KNEE ARTHROPLASTY;  Surgeon: Hessie Dibble, MD;  Location: [facility];  Service: Orthopedics;  Laterality: Left;  Primary left total knee with revision components   . Total knee arthroplasty  08/16/2012    Procedure: TOTAL KNEE ARTHROPLASTY;  Surgeon: Hessie Dibble, MD;  Location: [facility];  Service: Orthopedics;  Laterality: Right;  Difficult Airway Awake Intubation per Anesthesia  . Carpal tunnel release Right 02/22/2014    Procedure: RIGHT CARPAL TUNNEL RELEASE;  Surgeon: Tennis Must, MD;  Location: [facility];  Service: Orthopedics;  Laterality: Right;    Family History  Problem Relation Age of Onset  . Anesthesia problems Mother    Social History:  reports that she has never smoked. She has never used smokeless tobacco. She reports that she drinks alcohol. She reports that she does not use illicit drugs.  Allergies:  Allergies  Allergen Reactions  . Sulfa Antibiotics Other (See Comments)    Blood in urine    No prescriptions prior to admission    No results found for this or any previous visit (from the past 48 hour(s)).  No results found.   A comprehensive review of systems was negative except for: Eyes: positive for cataracts and contacts/glasses  Height 5\' 6"  (1.676 m), weight 111.131 kg (245 lb).  General appearance: alert, cooperative and appears stated age Head: Normocephalic, without obvious abnormality, atraumatic Neck: supple, symmetrical, trachea midline Resp: clear to auscultation bilaterally Cardio: regular rate and rhythm GI: non tender Extremities: intact sensation and capillary refill all digits.  +epl/fpl/io.  no wounds. Pulses: 2+ and symmetric Skin: Skin color, texture, turgor normal. No rashes or lesions Neurologic: Grossly normal Incision/Wound:  none  Assessment/Plan Left carpal tunnel syndrome.  Non operative and operative treatment options were discussed with the patient and patient wishes to proceed with operative treatment. Risks, benefits, and alternatives of surgery were discussed and the patient agrees with the plan of care.   Kristina Perkins R 07/26/2014, 9:21 AM

## 2014-07-27 ENCOUNTER — Encounter (HOSPITAL_BASED_OUTPATIENT_CLINIC_OR_DEPARTMENT_OTHER): Payer: Self-pay | Admitting: Orthopedic Surgery

## 2014-09-19 ENCOUNTER — Ambulatory Visit
Admission: RE | Admit: 2014-09-19 | Discharge: 2014-09-19 | Disposition: A | Discharge status: Home / Self Care | Payer: 59 | Source: Ambulatory Visit | Attending: Family Medicine | Admitting: Family Medicine

## 2014-09-19 ENCOUNTER — Other Ambulatory Visit: Payer: Self-pay | Admitting: Family Medicine

## 2014-09-19 DIAGNOSIS — M545 Low back pain: Secondary | ICD-10-CM

## 2015-11-06 ENCOUNTER — Ambulatory Visit (INDEPENDENT_AMBULATORY_CARE_PROVIDER_SITE_OTHER): Payer: Medicare Other | Admitting: Podiatry

## 2015-11-06 ENCOUNTER — Ambulatory Visit: Payer: Medicare Other

## 2015-11-06 ENCOUNTER — Encounter: Payer: Self-pay | Admitting: Podiatry

## 2015-11-06 VITALS — BP 102/54 | HR 76 | Resp 12

## 2015-11-06 DIAGNOSIS — S90852A Superficial foreign body, left foot, initial encounter: Secondary | ICD-10-CM | POA: Diagnosis not present

## 2015-11-06 DIAGNOSIS — R52 Pain, unspecified: Secondary | ICD-10-CM

## 2015-11-06 NOTE — Progress Notes (Signed)
   Subjective:    Patient ID: Kristina Perkins, female    DOB: [birth year], 69 y.o.   MRN: [id number]  HPI   This patient presents today stating that approximately 5 days ago when she was walking in her house without shoes she stepped on a piece of glass and describes discomfort in the plantar left heel. Her husband attempted to remove the glass fragment, however, patient was not sure whether the fragment was totally removed. Since this episode patient has described decreasing discomfort in the plantar left heel. Patient admits that she walks on a continuous basis in her house without shoes and describes a drop on injury causing some injury to the dorsum the right hallux  Patient is a diabetic and denies any history of claudication, foot ulceration or amputation   Review of Systems  Constitutional: Positive for unexpected weight change.  Cardiovascular: Positive for leg swelling.       Objective:   Physical Exam  Orientated 3  Vascular: No peripheral edema noted bilaterally DP and PT pulses 2/4 bilaterally Capillary reflex immediate bilaterally  Neurological: Sensation to 10 g monofilament wire intact 5/5 bilaterally Vibratory sensation reactive bilaterally Ankle reflex equal and reactive bilaterally  Dermatological: No open skin lesions bilaterally Eschar dorsum of right hallux with low-grade edema (history of trauma, drop on injury) Plantar aspect of right heel has 5 dark black punctate areas that are less than a millimeter in diameter. There is no surrounding erythema, edema surrounding these areas  The plantar aspect of the left foot has 1 similar black punctate lesion on the central left heel. There is no surrounding erythema, edema, warmth. The patient states that this is where she feels the glass entered  Musculoskeletal: Hammertoe second bilaterally    Assessment & Plan:   Assessment: Satisfactory neurovascular status Probing of the plantar left heel as described  below demonstrates no foreign body at this time No clinical sign of infection in the left heel Multiple punctate darkened areas plantar right heel suggest multiple superficial foreign bodies without clinical sign of infection  Plan: Palpation in this area elicits no special discomfort. The plantar left heel was painted with Betadine. Using sterile technique the area of this isolated dark punctate lesion was trimmed and the darkened central area was probed and debrided. The dark area appeared to be more of an eschar. Probing superficially with slight bleeding not into the fat pad area was probed and no foreign body was noted. Palpation of the area that was probed i elicits no tenderness as described by the patient. An antibiotic dressing was applied to this area  A she was advised to continue applying topical antibiotic ointment and a dressing until the plantar wound was healed. She was advised to continually observe this area and if she notices any increase of pain, redness, swelling, drainage present to ED or notify our office. Also, I strongly encouraged patient to wear shoes at all times except when showering and sleeping  Reappoint at patient's request

## 2015-11-06 NOTE — Patient Instructions (Signed)
Today your diabetic foot examination demonstrated good pulsations and feeling was good The darkened area on the bottom of the left heel was probed and a small fragment of black debris was removed, however, not obvious glass Removed bandage and the bilateral left foot in 1-2 days on the left foot and continue to apply topical antibiotic ointment and a dressing over the area until a scab forms If you develop any sudden pain, swelling, redness present for further evaluation of present to ED The bottom of the right foot demonstrate small blackened punctate areas that were consistent appearance of the darkened area in the left heel I strongly recommended she wear shoes at all times except when showering sleeping    Diabetes and Foot Care Diabetes may cause you to have problems because of poor blood supply (circulation) to your feet and legs. This may cause the skin on your feet to become thinner, break easier, and heal more slowly. Your skin may become dry, and the skin may peel and crack. You may also have nerve damage in your legs and feet causing decreased feeling in them. You may not notice minor injuries to your feet that could lead to infections or more serious problems. Taking care of your feet is one of the most important things you can do for yourself.  HOME CARE INSTRUCTIONS  Wear shoes at all times, even in the house. Do not go barefoot. Bare feet are easily injured.  Check your feet daily for blisters, cuts, and redness. If you cannot see the bottom of your feet, use a mirror or ask someone for help.  Wash your feet with warm water (do not use hot water) and mild soap. Then pat your feet and the areas between your toes until they are completely dry. Do not soak your feet as this can dry your skin.  Apply a moisturizing lotion or petroleum jelly (that does not contain alcohol and is unscented) to the skin on your feet and to dry, brittle toenails. Do not apply lotion between your  toes.  Trim your toenails straight across. Do not dig under them or around the cuticle. File the edges of your nails with an emery board or nail file.  Do not cut corns or calluses or try to remove them with medicine.  Wear clean socks or stockings every day. Make sure they are not too tight. Do not wear knee-high stockings since they may decrease blood flow to your legs.  Wear shoes that fit properly and have enough cushioning. To break in new shoes, wear them for just a few hours a day. This prevents you from injuring your feet. Always look in your shoes before you put them on to be sure there are no objects inside.  Do not cross your legs. This may decrease the blood flow to your feet.  If you find a minor scrape, cut, or break in the skin on your feet, keep it and the skin around it clean and dry. These areas may be cleansed with mild soap and water. Do not cleanse the area with peroxide, alcohol, or iodine.  When you remove an adhesive bandage, be sure not to damage the skin around it.  If you have a wound, look at it several times a day to make sure it is healing.  Do not use heating pads or hot water bottles. They may burn your skin. If you have lost feeling in your feet or legs, you may not know it is happening until  it is too late.  Make sure your health care provider performs a complete foot exam at least annually or more often if you have foot problems. Report any cuts, sores, or bruises to your health care provider immediately. SEEK MEDICAL CARE IF:   You have an injury that is not healing.  You have cuts or breaks in the skin.  You have an ingrown nail.  You notice redness on your legs or feet.  You feel burning or tingling in your legs or feet.  You have pain or cramps in your legs and feet.  Your legs or feet are numb.  Your feet always feel cold. SEEK IMMEDIATE MEDICAL CARE IF:   There is increasing redness, swelling, or pain in or around a wound.  There is a  red line that goes up your leg.  Pus is coming from a wound.  You develop a fever or as directed by your health care provider.  You notice a bad smell coming from an ulcer or wound.   This information is not intended to replace advice given to you by your health care provider. Make sure you discuss any questions you have with your health care provider.   Document Released: 11/06/2000 Document Revised: 07/12/2013 Document Reviewed: 04/18/2013 Elsevier Interactive Patient Education [employer].

## 2016-05-25 ENCOUNTER — Other Ambulatory Visit: Payer: Self-pay | Admitting: Family Medicine

## 2016-05-25 ENCOUNTER — Ambulatory Visit
Admission: RE | Admit: 2016-05-25 | Discharge: 2016-05-25 | Disposition: A | Discharge status: Home / Self Care | Payer: Medicare Other | Source: Ambulatory Visit | Attending: Family Medicine | Admitting: Family Medicine

## 2016-05-25 DIAGNOSIS — M79671 Pain in right foot: Secondary | ICD-10-CM

## 2016-05-25 DIAGNOSIS — S5011XA Contusion of right forearm, initial encounter: Secondary | ICD-10-CM

## 2016-05-25 DIAGNOSIS — S8001XA Contusion of right knee, initial encounter: Secondary | ICD-10-CM

## 2016-05-25 DIAGNOSIS — S8002XA Contusion of left knee, initial encounter: Secondary | ICD-10-CM

## 2016-09-16 ENCOUNTER — Ambulatory Visit (INDEPENDENT_AMBULATORY_CARE_PROVIDER_SITE_OTHER): Payer: Medicare Other

## 2016-09-16 ENCOUNTER — Ambulatory Visit (INDEPENDENT_AMBULATORY_CARE_PROVIDER_SITE_OTHER): Payer: Medicare Other | Admitting: Podiatry

## 2016-09-16 ENCOUNTER — Encounter: Payer: Self-pay | Admitting: Podiatry

## 2016-09-16 VITALS — BP 149/78 | HR 61

## 2016-09-16 DIAGNOSIS — M79671 Pain in right foot: Secondary | ICD-10-CM

## 2016-09-16 DIAGNOSIS — M779 Enthesopathy, unspecified: Secondary | ICD-10-CM

## 2016-09-16 MED ORDER — TRIAMCINOLONE ACETONIDE 10 MG/ML IJ SUSP
10.0000 mg | Freq: Once | INTRAMUSCULAR | Status: AC
  Administered 2016-09-16: 10 mg

## 2016-09-17 NOTE — Progress Notes (Signed)
Subjective:     Patient ID: Kristina Perkins, female   DOB: 1946-02-28, 70 y.o.   MRN: [id number]  HPI patient presents with quite a bit of discomfort on the dorsum of the right foot stating she does not remember specific injury but it's been hurting for about 9 months. Worse when she tries to walk   Review of Systems     Objective:   Physical Exam Neurovascular status intact with discomfort of the midtarsal joint right dorsal with mild swelling noted but no indications of systems formation    Assessment:     Tendinitis of the extensor complex right with possibility for arthritis    Plan:     H&P condition reviewed and recommended dorsal injection 3 mg Kenalog 5 mg Xylocaine which was accomplished today. Patient also had compounding creams prescribed and we will see results and decide what may be appropriate in the future  X-ray report indicated mild changes midtarsal joint but no indications of acute spur formation

## 2017-07-15 ENCOUNTER — Other Ambulatory Visit: Payer: Self-pay | Admitting: Family Medicine

## 2017-07-15 DIAGNOSIS — Z1231 Encounter for screening mammogram for malignant neoplasm of breast: Secondary | ICD-10-CM

## 2017-07-30 ENCOUNTER — Ambulatory Visit
Admission: RE | Admit: 2017-07-30 | Discharge: 2017-07-30 | Disposition: A | Discharge status: Home / Self Care | Payer: Medicare Other | Source: Ambulatory Visit | Attending: Family Medicine | Admitting: Family Medicine

## 2017-07-30 DIAGNOSIS — Z1231 Encounter for screening mammogram for malignant neoplasm of breast: Secondary | ICD-10-CM

## 2018-04-26 ENCOUNTER — Ambulatory Visit
Admission: RE | Admit: 2018-04-26 | Discharge: 2018-04-26 | Disposition: A | Discharge status: Home / Self Care | Payer: Medicare Other | Source: Ambulatory Visit | Attending: Family Medicine | Admitting: Family Medicine

## 2018-04-26 ENCOUNTER — Other Ambulatory Visit: Payer: Self-pay | Admitting: Family Medicine

## 2018-04-26 DIAGNOSIS — M25551 Pain in right hip: Secondary | ICD-10-CM

## 2018-05-03 ENCOUNTER — Other Ambulatory Visit: Payer: Self-pay | Admitting: Family Medicine

## 2018-05-03 DIAGNOSIS — M5441 Lumbago with sciatica, right side: Secondary | ICD-10-CM

## 2018-05-04 ENCOUNTER — Other Ambulatory Visit: Payer: Self-pay | Admitting: Family Medicine

## 2018-05-04 DIAGNOSIS — R609 Edema, unspecified: Secondary | ICD-10-CM

## 2019-10-03 ENCOUNTER — Other Ambulatory Visit: Payer: Self-pay | Admitting: Family Medicine

## 2019-10-03 DIAGNOSIS — Z1231 Encounter for screening mammogram for malignant neoplasm of breast: Secondary | ICD-10-CM

## 2019-11-27 ENCOUNTER — Inpatient Hospital Stay: Admission: RE | Admit: 2019-11-27 | Payer: Medicare Other | Source: Ambulatory Visit

## 2019-12-25 ENCOUNTER — Ambulatory Visit: Payer: Medicare Other

## 2019-12-31 ENCOUNTER — Ambulatory Visit: Payer: Medicare Other | Attending: Internal Medicine

## 2019-12-31 DIAGNOSIS — Z23 Encounter for immunization: Secondary | ICD-10-CM | POA: Insufficient documentation

## 2019-12-31 NOTE — Progress Notes (Signed)
   Covid-19 Vaccination Clinic  Name:  Kristina Perkins    MRN: [id number] DOB: [birth year]  12/31/2019  [name] was observed post Covid-19 immunization for 15 minutes without incidence. She was provided with Vaccine Information Sheet and instruction to access the V-Safe system.   [name] was instructed to call 911 with any severe reactions post vaccine: Marland Kitchen Difficulty breathing  . Swelling of your face and throat  . A fast heartbeat  . A bad rash all over your body  . Dizziness and weakness    Immunizations Administered    Name Date Dose VIS Date Route   Pfizer COVID-19 Vaccine 12/31/2019 12:55 PM 0.3 mL 11/03/2019 Intramuscular   Manufacturer: [place]   Lot: [id number]   [place]: [id number]

## 2020-01-25 ENCOUNTER — Ambulatory Visit: Payer: Medicare Other | Attending: Internal Medicine

## 2020-01-25 DIAGNOSIS — Z23 Encounter for immunization: Secondary | ICD-10-CM

## 2020-01-25 NOTE — Progress Notes (Signed)
   Covid-19 Vaccination Clinic  Name:  Kristina Perkins    MRN: [id number] DOB: [birth year]  01/25/2020  [name] was observed post Covid-19 immunization for 15 minutes without incident. She was provided with Vaccine Information Sheet and instruction to access the V-Safe system.   [name] was instructed to call 911 with any severe reactions post vaccine: Marland Kitchen Difficulty breathing  . Swelling of face and throat  . A fast heartbeat  . A bad rash all over body  . Dizziness and weakness   Immunizations Administered    Name Date Dose VIS Date Route   Pfizer COVID-19 Vaccine 01/25/2020  8:36 AM 0.3 mL 11/03/2019 Intramuscular   Manufacturer: [place]   Lot: [id number]   [place]: [id number]

## 2020-12-12 DIAGNOSIS — G4733 Obstructive sleep apnea (adult) (pediatric): Secondary | ICD-10-CM | POA: Diagnosis not present

## 2020-12-12 DIAGNOSIS — I10 Essential (primary) hypertension: Secondary | ICD-10-CM | POA: Diagnosis not present

## 2020-12-18 ENCOUNTER — Ambulatory Visit (HOSPITAL_COMMUNITY)
Admission: RE | Admit: 2020-12-18 | Discharge: 2020-12-18 | Disposition: A | Discharge status: Home / Self Care | Payer: Medicare Other | Source: Ambulatory Visit | Attending: Pulmonary Disease | Admitting: Pulmonary Disease

## 2020-12-18 ENCOUNTER — Encounter: Payer: Self-pay | Admitting: Family

## 2020-12-18 ENCOUNTER — Telehealth: Payer: Self-pay | Admitting: Family

## 2020-12-18 ENCOUNTER — Other Ambulatory Visit: Payer: Self-pay | Admitting: Family

## 2020-12-18 DIAGNOSIS — U071 COVID-19: Secondary | ICD-10-CM

## 2020-12-18 DIAGNOSIS — E119 Type 2 diabetes mellitus without complications: Secondary | ICD-10-CM | POA: Diagnosis not present

## 2020-12-18 MED ORDER — EPINEPHRINE 0.3 MG/0.3ML IJ SOAJ: 0.3000 mg | Freq: Once | INTRAMUSCULAR | Status: DC | PRN

## 2020-12-18 MED ORDER — METHYLPREDNISOLONE SODIUM SUCC 125 MG IJ SOLR: 125.0000 mg | Freq: Once | INTRAMUSCULAR | Status: DC | PRN

## 2020-12-18 MED ORDER — ALBUTEROL SULFATE HFA 108 (90 BASE) MCG/ACT IN AERS: 2.0000 | INHALATION_SPRAY | Freq: Once | RESPIRATORY_TRACT | Status: DC | PRN

## 2020-12-18 MED ORDER — SODIUM CHLORIDE 0.9 % IV SOLN: INTRAVENOUS | Status: DC | PRN

## 2020-12-18 MED ORDER — SOTROVIMAB 500 MG/8ML IV SOLN
500.0000 mg | Freq: Once | INTRAVENOUS | Status: AC
  Administered 2020-12-18: 500 mg via INTRAVENOUS

## 2020-12-18 MED ORDER — FAMOTIDINE IN NACL 20-0.9 MG/50ML-% IV SOLN: 20.0000 mg | Freq: Once | INTRAVENOUS | Status: DC | PRN

## 2020-12-18 MED ORDER — DIPHENHYDRAMINE HCL 50 MG/ML IJ SOLN: 50.0000 mg | Freq: Once | INTRAMUSCULAR | Status: DC | PRN

## 2020-12-18 NOTE — Discharge Instructions (Signed)

## 2020-12-18 NOTE — Progress Notes (Signed)
Diagnosis: COVID-19  Physician: Dr. Patrick Wright  Procedure: Covid Infusion Clinic Med: Sotrovimab infusion - Provided patient with sotrovimab fact sheet for patients, parents, and caregivers prior to infusion.   Complications: No immediate complications noted  Discharge: Discharged home    

## 2020-12-18 NOTE — Telephone Encounter (Signed)
I connected by phone with Kristina Perkins on 12/18/2020 at 9:10 AM to discuss the potential use of a new treatment for mild to moderate COVID-19 viral infection in non-hospitalized patients.  This patient is a 76 y.o. female that meets the FDA criteria for Emergency Use Authorization of COVID monoclonal antibody sotrovimab.  Has a (+) direct SARS-CoV-2 viral test result  Has mild or moderate COVID-19   Is NOT hospitalized due to COVID-19  Is within 10 days of symptom onset  Has at least one of the high risk factor(s) for progression to severe COVID-19 and/or hospitalization as defined in EUA.  Specific high risk criteria : Older age (>/= 75 yo), BMI > 25, Diabetes and Cardiovascular disease or hypertension   I have spoken and communicated the following to the patient or parent/caregiver regarding COVID monoclonal antibody treatment:  1. FDA has authorized the emergency use for the treatment of mild to moderate COVID-19 in adults and pediatric patients with positive results of direct SARS-CoV-2 viral testing who are 77 years of age and older weighing at least 40 kg, and who are at high risk for progressing to severe COVID-19 and/or hospitalization.  2. The significant known and potential risks and benefits of COVID monoclonal antibody, and the extent to which such potential risks and benefits are unknown.  3. Information on available alternative treatments and the risks and benefits of those alternatives, including clinical trials.  4. Patients treated with COVID monoclonal antibody should continue to self-isolate and use infection control measures (e.g., wear mask, isolate, social distance, avoid sharing personal items, clean and disinfect "high touch" surfaces, and frequent handwashing) according to CDC guidelines.   5. The patient or parent/caregiver has the option to accept or refuse COVID monoclonal antibody treatment.  After reviewing this information with the patient, the patient has  agreed to receive one of the available covid 19 monoclonal antibodies and will be provided an appropriate fact sheet prior to infusion. Loel Dubonnet, NP 12/18/2020 9:10 AM

## 2020-12-18 NOTE — Progress Notes (Signed)
Patient reviewed Fact Sheet for Patients, Parents, and Caregivers for Emergency Use Authorization (EUA) of sotrovimab for the Treatment of Coronavirus. Patient also reviewed and is agreeable to the estimated cost of treatment. Patient is agreeable to proceed.   

## 2021-02-19 DIAGNOSIS — G4700 Insomnia, unspecified: Secondary | ICD-10-CM | POA: Diagnosis not present

## 2021-02-19 DIAGNOSIS — E785 Hyperlipidemia, unspecified: Secondary | ICD-10-CM | POA: Diagnosis not present

## 2021-02-19 DIAGNOSIS — E1139 Type 2 diabetes mellitus with other diabetic ophthalmic complication: Secondary | ICD-10-CM | POA: Diagnosis not present

## 2021-02-19 DIAGNOSIS — H35033 Hypertensive retinopathy, bilateral: Secondary | ICD-10-CM | POA: Diagnosis not present

## 2021-02-19 DIAGNOSIS — I10 Essential (primary) hypertension: Secondary | ICD-10-CM | POA: Diagnosis not present

## 2021-02-19 DIAGNOSIS — E1169 Type 2 diabetes mellitus with other specified complication: Secondary | ICD-10-CM | POA: Diagnosis not present

## 2021-02-19 DIAGNOSIS — M150 Primary generalized (osteo)arthritis: Secondary | ICD-10-CM | POA: Diagnosis not present

## 2021-03-14 DIAGNOSIS — E1169 Type 2 diabetes mellitus with other specified complication: Secondary | ICD-10-CM | POA: Diagnosis not present

## 2021-03-14 DIAGNOSIS — M7061 Trochanteric bursitis, right hip: Secondary | ICD-10-CM | POA: Diagnosis not present

## 2021-03-14 DIAGNOSIS — E538 Deficiency of other specified B group vitamins: Secondary | ICD-10-CM | POA: Diagnosis not present

## 2021-03-14 DIAGNOSIS — I10 Essential (primary) hypertension: Secondary | ICD-10-CM | POA: Diagnosis not present

## 2021-03-14 DIAGNOSIS — E785 Hyperlipidemia, unspecified: Secondary | ICD-10-CM | POA: Diagnosis not present

## 2021-03-14 DIAGNOSIS — M7062 Trochanteric bursitis, left hip: Secondary | ICD-10-CM | POA: Diagnosis not present

## 2021-03-24 DIAGNOSIS — I10 Essential (primary) hypertension: Secondary | ICD-10-CM | POA: Diagnosis not present

## 2021-03-24 DIAGNOSIS — G4733 Obstructive sleep apnea (adult) (pediatric): Secondary | ICD-10-CM | POA: Diagnosis not present

## 2021-05-15 DIAGNOSIS — H35033 Hypertensive retinopathy, bilateral: Secondary | ICD-10-CM | POA: Diagnosis not present

## 2021-05-15 DIAGNOSIS — M150 Primary generalized (osteo)arthritis: Secondary | ICD-10-CM | POA: Diagnosis not present

## 2021-05-15 DIAGNOSIS — I10 Essential (primary) hypertension: Secondary | ICD-10-CM | POA: Diagnosis not present

## 2021-05-15 DIAGNOSIS — G4700 Insomnia, unspecified: Secondary | ICD-10-CM | POA: Diagnosis not present

## 2021-05-15 DIAGNOSIS — E1169 Type 2 diabetes mellitus with other specified complication: Secondary | ICD-10-CM | POA: Diagnosis not present

## 2021-05-15 DIAGNOSIS — E785 Hyperlipidemia, unspecified: Secondary | ICD-10-CM | POA: Diagnosis not present

## 2021-05-15 DIAGNOSIS — E1139 Type 2 diabetes mellitus with other diabetic ophthalmic complication: Secondary | ICD-10-CM | POA: Diagnosis not present

## 2021-07-17 DIAGNOSIS — M150 Primary generalized (osteo)arthritis: Secondary | ICD-10-CM | POA: Diagnosis not present

## 2021-07-17 DIAGNOSIS — G4700 Insomnia, unspecified: Secondary | ICD-10-CM | POA: Diagnosis not present

## 2021-07-17 DIAGNOSIS — I10 Essential (primary) hypertension: Secondary | ICD-10-CM | POA: Diagnosis not present

## 2021-07-17 DIAGNOSIS — H35033 Hypertensive retinopathy, bilateral: Secondary | ICD-10-CM | POA: Diagnosis not present

## 2021-07-17 DIAGNOSIS — E785 Hyperlipidemia, unspecified: Secondary | ICD-10-CM | POA: Diagnosis not present

## 2021-07-17 DIAGNOSIS — E1169 Type 2 diabetes mellitus with other specified complication: Secondary | ICD-10-CM | POA: Diagnosis not present

## 2021-07-17 DIAGNOSIS — E1139 Type 2 diabetes mellitus with other diabetic ophthalmic complication: Secondary | ICD-10-CM | POA: Diagnosis not present

## 2021-09-22 DIAGNOSIS — Z7984 Long term (current) use of oral hypoglycemic drugs: Secondary | ICD-10-CM | POA: Diagnosis not present

## 2021-09-22 DIAGNOSIS — M545 Low back pain, unspecified: Secondary | ICD-10-CM | POA: Diagnosis not present

## 2021-09-22 DIAGNOSIS — E1169 Type 2 diabetes mellitus with other specified complication: Secondary | ICD-10-CM | POA: Diagnosis not present

## 2021-09-22 DIAGNOSIS — Z23 Encounter for immunization: Secondary | ICD-10-CM | POA: Diagnosis not present

## 2021-09-22 DIAGNOSIS — M7062 Trochanteric bursitis, left hip: Secondary | ICD-10-CM | POA: Diagnosis not present

## 2021-09-22 DIAGNOSIS — E785 Hyperlipidemia, unspecified: Secondary | ICD-10-CM | POA: Diagnosis not present

## 2021-09-22 DIAGNOSIS — M7061 Trochanteric bursitis, right hip: Secondary | ICD-10-CM | POA: Diagnosis not present

## 2021-09-22 DIAGNOSIS — I10 Essential (primary) hypertension: Secondary | ICD-10-CM | POA: Diagnosis not present

## 2021-09-22 DIAGNOSIS — Z791 Long term (current) use of non-steroidal anti-inflammatories (NSAID): Secondary | ICD-10-CM | POA: Diagnosis not present

## 2021-09-22 DIAGNOSIS — G4700 Insomnia, unspecified: Secondary | ICD-10-CM | POA: Diagnosis not present

## 2021-09-22 DIAGNOSIS — E538 Deficiency of other specified B group vitamins: Secondary | ICD-10-CM | POA: Diagnosis not present

## 2021-09-22 DIAGNOSIS — Z0000 Encounter for general adult medical examination without abnormal findings: Secondary | ICD-10-CM | POA: Diagnosis not present

## 2021-10-07 DIAGNOSIS — M25551 Pain in right hip: Secondary | ICD-10-CM | POA: Diagnosis not present

## 2021-10-07 DIAGNOSIS — M25552 Pain in left hip: Secondary | ICD-10-CM | POA: Diagnosis not present

## 2021-10-30 DIAGNOSIS — M62551 Muscle wasting and atrophy, not elsewhere classified, right thigh: Secondary | ICD-10-CM | POA: Diagnosis not present

## 2021-10-30 DIAGNOSIS — M25651 Stiffness of right hip, not elsewhere classified: Secondary | ICD-10-CM | POA: Diagnosis not present

## 2021-10-30 DIAGNOSIS — M25551 Pain in right hip: Secondary | ICD-10-CM | POA: Diagnosis not present

## 2021-10-30 DIAGNOSIS — R2681 Unsteadiness on feet: Secondary | ICD-10-CM | POA: Diagnosis not present

## 2021-11-05 DIAGNOSIS — M62551 Muscle wasting and atrophy, not elsewhere classified, right thigh: Secondary | ICD-10-CM | POA: Diagnosis not present

## 2021-11-05 DIAGNOSIS — M25551 Pain in right hip: Secondary | ICD-10-CM | POA: Diagnosis not present

## 2021-11-05 DIAGNOSIS — M25651 Stiffness of right hip, not elsewhere classified: Secondary | ICD-10-CM | POA: Diagnosis not present

## 2021-11-05 DIAGNOSIS — R2681 Unsteadiness on feet: Secondary | ICD-10-CM | POA: Diagnosis not present

## 2021-11-10 DIAGNOSIS — R2681 Unsteadiness on feet: Secondary | ICD-10-CM | POA: Diagnosis not present

## 2021-11-10 DIAGNOSIS — M62551 Muscle wasting and atrophy, not elsewhere classified, right thigh: Secondary | ICD-10-CM | POA: Diagnosis not present

## 2021-11-10 DIAGNOSIS — M25651 Stiffness of right hip, not elsewhere classified: Secondary | ICD-10-CM | POA: Diagnosis not present

## 2021-11-10 DIAGNOSIS — M25551 Pain in right hip: Secondary | ICD-10-CM | POA: Diagnosis not present

## 2021-11-13 DIAGNOSIS — M25651 Stiffness of right hip, not elsewhere classified: Secondary | ICD-10-CM | POA: Diagnosis not present

## 2021-11-13 DIAGNOSIS — M62551 Muscle wasting and atrophy, not elsewhere classified, right thigh: Secondary | ICD-10-CM | POA: Diagnosis not present

## 2021-11-13 DIAGNOSIS — R2681 Unsteadiness on feet: Secondary | ICD-10-CM | POA: Diagnosis not present

## 2021-11-13 DIAGNOSIS — M25551 Pain in right hip: Secondary | ICD-10-CM | POA: Diagnosis not present

## 2021-11-18 DIAGNOSIS — R2681 Unsteadiness on feet: Secondary | ICD-10-CM | POA: Diagnosis not present

## 2021-11-18 DIAGNOSIS — M25651 Stiffness of right hip, not elsewhere classified: Secondary | ICD-10-CM | POA: Diagnosis not present

## 2021-11-18 DIAGNOSIS — M62551 Muscle wasting and atrophy, not elsewhere classified, right thigh: Secondary | ICD-10-CM | POA: Diagnosis not present

## 2021-11-18 DIAGNOSIS — M25551 Pain in right hip: Secondary | ICD-10-CM | POA: Diagnosis not present

## 2021-11-21 DIAGNOSIS — M25651 Stiffness of right hip, not elsewhere classified: Secondary | ICD-10-CM | POA: Diagnosis not present

## 2021-11-21 DIAGNOSIS — M25551 Pain in right hip: Secondary | ICD-10-CM | POA: Diagnosis not present

## 2021-11-21 DIAGNOSIS — M62551 Muscle wasting and atrophy, not elsewhere classified, right thigh: Secondary | ICD-10-CM | POA: Diagnosis not present

## 2021-11-21 DIAGNOSIS — R2681 Unsteadiness on feet: Secondary | ICD-10-CM | POA: Diagnosis not present

## 2021-11-25 DIAGNOSIS — M25651 Stiffness of right hip, not elsewhere classified: Secondary | ICD-10-CM | POA: Diagnosis not present

## 2021-11-25 DIAGNOSIS — M62551 Muscle wasting and atrophy, not elsewhere classified, right thigh: Secondary | ICD-10-CM | POA: Diagnosis not present

## 2021-11-25 DIAGNOSIS — R2681 Unsteadiness on feet: Secondary | ICD-10-CM | POA: Diagnosis not present

## 2021-11-25 DIAGNOSIS — M25551 Pain in right hip: Secondary | ICD-10-CM | POA: Diagnosis not present

## 2021-12-04 DIAGNOSIS — M25651 Stiffness of right hip, not elsewhere classified: Secondary | ICD-10-CM | POA: Diagnosis not present

## 2021-12-04 DIAGNOSIS — R2681 Unsteadiness on feet: Secondary | ICD-10-CM | POA: Diagnosis not present

## 2021-12-04 DIAGNOSIS — M25551 Pain in right hip: Secondary | ICD-10-CM | POA: Diagnosis not present

## 2021-12-04 DIAGNOSIS — M62551 Muscle wasting and atrophy, not elsewhere classified, right thigh: Secondary | ICD-10-CM | POA: Diagnosis not present

## 2021-12-08 DIAGNOSIS — R2681 Unsteadiness on feet: Secondary | ICD-10-CM | POA: Diagnosis not present

## 2021-12-08 DIAGNOSIS — M62551 Muscle wasting and atrophy, not elsewhere classified, right thigh: Secondary | ICD-10-CM | POA: Diagnosis not present

## 2021-12-08 DIAGNOSIS — M25551 Pain in right hip: Secondary | ICD-10-CM | POA: Diagnosis not present

## 2021-12-08 DIAGNOSIS — M25651 Stiffness of right hip, not elsewhere classified: Secondary | ICD-10-CM | POA: Diagnosis not present

## 2021-12-18 DIAGNOSIS — M25551 Pain in right hip: Secondary | ICD-10-CM | POA: Diagnosis not present

## 2021-12-18 DIAGNOSIS — M25651 Stiffness of right hip, not elsewhere classified: Secondary | ICD-10-CM | POA: Diagnosis not present

## 2021-12-18 DIAGNOSIS — R2681 Unsteadiness on feet: Secondary | ICD-10-CM | POA: Diagnosis not present

## 2021-12-18 DIAGNOSIS — M62551 Muscle wasting and atrophy, not elsewhere classified, right thigh: Secondary | ICD-10-CM | POA: Diagnosis not present

## 2021-12-30 DIAGNOSIS — U071 COVID-19: Secondary | ICD-10-CM | POA: Diagnosis not present

## 2022-01-30 DIAGNOSIS — I10 Essential (primary) hypertension: Secondary | ICD-10-CM | POA: Diagnosis not present

## 2022-01-30 DIAGNOSIS — D225 Melanocytic nevi of trunk: Secondary | ICD-10-CM | POA: Diagnosis not present

## 2022-01-30 DIAGNOSIS — L814 Other melanin hyperpigmentation: Secondary | ICD-10-CM | POA: Diagnosis not present

## 2022-01-30 DIAGNOSIS — D2271 Melanocytic nevi of right lower limb, including hip: Secondary | ICD-10-CM | POA: Diagnosis not present

## 2022-01-30 DIAGNOSIS — D485 Neoplasm of uncertain behavior of skin: Secondary | ICD-10-CM | POA: Diagnosis not present

## 2022-01-30 DIAGNOSIS — D2272 Melanocytic nevi of left lower limb, including hip: Secondary | ICD-10-CM | POA: Diagnosis not present

## 2022-01-30 DIAGNOSIS — Z85820 Personal history of malignant melanoma of skin: Secondary | ICD-10-CM | POA: Diagnosis not present

## 2022-01-30 DIAGNOSIS — D2262 Melanocytic nevi of left upper limb, including shoulder: Secondary | ICD-10-CM | POA: Diagnosis not present

## 2022-01-30 DIAGNOSIS — G4733 Obstructive sleep apnea (adult) (pediatric): Secondary | ICD-10-CM | POA: Diagnosis not present

## 2022-01-30 DIAGNOSIS — D2261 Melanocytic nevi of right upper limb, including shoulder: Secondary | ICD-10-CM | POA: Diagnosis not present

## 2022-02-23 ENCOUNTER — Emergency Department (HOSPITAL_BASED_OUTPATIENT_CLINIC_OR_DEPARTMENT_OTHER)
Admission: EM | Admit: 2022-02-23 | Discharge: 2022-02-23 | Disposition: A | Discharge status: Home / Self Care | Payer: Medicare Other | Attending: Emergency Medicine | Admitting: Emergency Medicine

## 2022-02-23 ENCOUNTER — Emergency Department (HOSPITAL_BASED_OUTPATIENT_CLINIC_OR_DEPARTMENT_OTHER): Payer: Medicare Other

## 2022-02-23 ENCOUNTER — Encounter (HOSPITAL_BASED_OUTPATIENT_CLINIC_OR_DEPARTMENT_OTHER): Payer: Self-pay | Admitting: Obstetrics and Gynecology

## 2022-02-23 ENCOUNTER — Other Ambulatory Visit: Payer: Self-pay

## 2022-02-23 DIAGNOSIS — M7989 Other specified soft tissue disorders: Secondary | ICD-10-CM | POA: Diagnosis not present

## 2022-02-23 DIAGNOSIS — M25572 Pain in left ankle and joints of left foot: Secondary | ICD-10-CM | POA: Diagnosis not present

## 2022-02-23 DIAGNOSIS — L03116 Cellulitis of left lower limb: Secondary | ICD-10-CM | POA: Insufficient documentation

## 2022-02-23 DIAGNOSIS — H903 Sensorineural hearing loss, bilateral: Secondary | ICD-10-CM | POA: Diagnosis not present

## 2022-02-23 MED ORDER — CEPHALEXIN 500 MG PO CAPS: 500.0000 mg | ORAL_CAPSULE | Freq: Three times a day (TID) | ORAL | 0 refills | Status: AC

## 2022-02-23 MED ORDER — CEPHALEXIN 250 MG PO CAPS
500.0000 mg | ORAL_CAPSULE | Freq: Once | ORAL | Status: AC
  Administered 2022-02-23: 500 mg via ORAL
  Filled 2022-02-23: qty 2

## 2022-02-23 NOTE — ED Provider Notes (Signed)
?[facility] EM[facility] DEPT ?Provider Note ? ? ?CSN: [id number] ?Arrival date & time: 02/23/22  1834 ? ?  ? ?History ? ?Chief Complaint  ?Patient presents with  ? Ankle Injury  ? ? ?ILLA ENLOW is a 76 y.o. female. ? ?Patient is a 76 year old who presents with pain and swelling to her left ankle.  She said about 2 weeks ago up corn hole wooden board fell on the medial aspect of her left ankle.  She said there were no wounds to the area.  She had an ecchymosis initially which has resolved.  Over the last several days she has had some progressive redness to her ankle.  She has had some swelling around the ankle and lower leg.  She does not report any calf tenderness.  No chest pain or shortness of breath.  No known fevers.  She is able to ambulate on it but says its painful at times. ? ? ?  ? ?Home Medications ?Prior to Admission medications   ?Medication Sig Start Date End Date Taking? Authorizing Provider  ?cephALEXin (KEFLEX) 500 MG capsule Take 1 capsule (500 mg total) by mouth 3 (three) times daily. 02/23/22  Yes Malvin Johns, MD  ?Acetaminophen (TYLENOL 8 HOUR ARTHRITIS PAIN PO) Take by mouth.    [provider]  ?ALPRAZolam Duanne Moron) 0.25 MG tablet Take 0.25 mg by mouth 3 (three) times daily as needed. For anxiety    [provider]  ?glucose monitoring kit (FREESTYLE) monitoring kit 1 each by Does not apply route as needed for other.    [provider]  ?hydrochlorothiazide (HYDRODIURIL) 25 MG tablet Take 25 mg by mouth daily as needed.    [provider]  ?Lancets (ACCU-CHEK SOFT TOUCH) lancets 1 each by Other route as needed for other. Use as instructed    [provider]  ?losartan-hydrochlorothiazide (HYZAAR) 50-12.5 MG per tablet Take 1 tablet by mouth daily.    [provider]  ?meloxicam (MOBIC) 15 MG tablet Take 15 mg by mouth daily.    [provider]  ?metFORMIN (GLUMETZA) 500 MG (MOD) 24 hr tablet Take 1,000 mg by mouth daily  after supper.    [provider]  ?methocarbamol (ROBAXIN) 500 MG tablet Take 500 mg by mouth 4 (four) times daily.    [provider]  ?   ? ?Allergies    ?Sulfa antibiotics   ? ?Review of Systems   ?Review of Systems  ?Constitutional:  Negative for fever.  ?Gastrointestinal:  Negative for nausea and vomiting.  ?Musculoskeletal:  Positive for arthralgias and joint swelling. Negative for back pain and neck pain.  ?Skin:  Negative for wound.  ?Neurological:  Negative for weakness, numbness and headaches.  ? ?Physical Exam ?Updated Vital Signs ?BP (!) 152/66   Pulse 77   Temp 97.9 ?F (36.6 ?C)   Resp 14   SpO2 97%  ?Physical Exam ?Constitutional:   ?   Appearance: She is well-developed.  ?HENT:  ?   Head: Normocephalic and atraumatic.  ?Cardiovascular:  ?   Rate and Rhythm: Normal rate.  ?Pulmonary:  ?   Effort: Pulmonary effort is normal.  ?Musculoskeletal:     ?   General: Tenderness present.  ?   Cervical back: Normal range of motion and neck supple.  ?   Comments: Patient has swelling over the left ankle.  She has some mild erythema over the medial malleolus of the ankle.  There is tenderness to this area.  Pedal pulses are  intact.  She has normal sensation and motor function distally.  No wounds are noted.  There is no pain to the knee or foot.  ?Skin: ?   General: Skin is warm and dry.  ?Neurological:  ?   Mental Status: She is alert and oriented to person, place, and time.  ? ? ?ED Results / Procedures / Treatments   ?Labs ?(all labs ordered are listed, but only abnormal results are displayed) ?Labs Reviewed - No data to display ? ?EKG ?None ? ?Radiology ?DG Ankle Complete Left ? ?Result Date: 02/23/2022 ?CLINICAL DATA:  Injury EXAM: LEFT ANKLE COMPLETE - 3+ VIEW COMPARISON:  None. FINDINGS: There is ankle soft tissue swelling. There is no evidence of acute fracture. Probable tibiotalar joint effusion. Alignment is normal. There is moderate midfoot osteoarthritis. There are prominent plantar  and dorsal calcaneal spurs. IMPRESSION: Ankle soft tissue swelling and probable tibiotalar joint effusion. No visible fracture. Electronically Signed   By: Maurine Simmering M.D.   On: 02/23/2022 19:28  ? ?US Venous Img Lower Unilateral Left (DVT) ? ?Result Date: 02/23/2022 ?CLINICAL DATA:  Left leg swelling EXAM: LEFT LOWER EXTREMITY VENOUS DOPPLER ULTRASOUND TECHNIQUE: Gray-scale sonography with compression, as well as color and duplex ultrasound, were performed to evaluate the deep venous system(s) from the level of the common femoral vein through the popliteal and proximal calf veins. COMPARISON:  None. FINDINGS: VENOUS Normal compressibility of the common femoral, superficial femoral, and popliteal veins, as well as the visualized calf veins. Visualized portions of profunda femoral vein and great saphenous vein unremarkable. No filling defects to suggest DVT on grayscale or color Doppler imaging. Doppler waveforms show normal direction of venous flow, normal respiratory plasticity and response to augmentation. Limited views of the contralateral common femoral vein are unremarkable. OTHER Complex hypoechoic area in the medial ankle in the area of pain measures 1.7 x 1.4 x 0.8 cm. This may reflect small hematoma. Limitations: none IMPRESSION: No evidence of lower extremity DVT. Small complex fluid collection in the medial ankle in the area of pain, possibly small hematoma. Electronically Signed   By: Rolm Baptise M.D.   On: 02/23/2022 20:04   ? ?Procedures ?Procedures  ? ? ?Medications Ordered in ED ?Medications  ?cephALEXin (KEFLEX) capsule 500 mg (has no administration in time range)  ? ? ?ED Course/ Medical Decision Making/ A&P ?  ?                        ?Medical Decision Making ?Amount and/or Complexity of Data Reviewed ?Radiology: ordered. ? ?Risk ?Prescription drug management. ? ? ?Patient is a 76 year old female who presents with pain and swelling to her left ankle after she had an injury 2 weeks ago.  There  apparently was no wounds at the time.  She does not currently have any wounds.  She has some mild erythema and warmth over the medial malleolus.  She had an ultrasound which shows no evidence of DVT.  X-rays show no fracture.  These were interpreted by me.  She does not have any suggestions of septic arthritis.  There is no pain on range of motion in the joint space.  There is some tenderness over the medial aspect of the ankle.  Given the overlying redness and warmth, will treat with antibiotics for possible cellulitis.  Instructed her on ice and elevation.  Advised her to follow-up with her PCP within 2 to 3 days for recheck.  Return precautions were given. ? ?Final  Clinical Impression(s) / ED Diagnoses ?Final diagnoses:  ?Acute left ankle pain  ?Cellulitis of left lower extremity  ? ? ?Rx / DC Orders ?ED Discharge Orders   ? ?      Ordered  ?  cephALEXin (KEFLEX) 500 MG capsule  3 times daily       ? 02/23/22 2044  ? ?  ?  ? ?  ? ? ?  ?Malvin Johns, MD ?02/23/22 2045 ? ?

## 2022-02-23 NOTE — ED Triage Notes (Signed)
Patient reports a corn hole board fell on her right ankle on x2 weeks ago. Patient reports she has continued to have ankle swelling and there is obvious redness to the skin of the ankle. Patient is able to walk but reports skin tenderness to the ankle  ?

## 2022-02-23 NOTE — ED Notes (Signed)
Per pt she had a corn hole wooden board 2 weeks ago and the brusing s=went away but it has stayed red and it goes around her  foot  , she can  walk and is painful to touch has good pulses  ?

## 2022-02-23 NOTE — Discharge Instructions (Addendum)
Keep your leg elevated as much as possible.  Ice it several times a day.  Follow-up with your doctor within the next 2 to 3 days for recheck.  Return here as needed if you have any worsening symptoms. ?

## 2022-02-26 DIAGNOSIS — T7840XA Allergy, unspecified, initial encounter: Secondary | ICD-10-CM | POA: Diagnosis not present

## 2022-02-26 DIAGNOSIS — H9193 Unspecified hearing loss, bilateral: Secondary | ICD-10-CM | POA: Diagnosis not present

## 2022-02-26 DIAGNOSIS — L03116 Cellulitis of left lower limb: Secondary | ICD-10-CM | POA: Diagnosis not present

## 2022-03-10 DIAGNOSIS — I872 Venous insufficiency (chronic) (peripheral): Secondary | ICD-10-CM | POA: Diagnosis not present

## 2022-03-10 DIAGNOSIS — D1801 Hemangioma of skin and subcutaneous tissue: Secondary | ICD-10-CM | POA: Diagnosis not present

## 2022-03-10 DIAGNOSIS — D225 Melanocytic nevi of trunk: Secondary | ICD-10-CM | POA: Diagnosis not present

## 2022-03-10 DIAGNOSIS — D485 Neoplasm of uncertain behavior of skin: Secondary | ICD-10-CM | POA: Diagnosis not present

## 2022-03-12 DIAGNOSIS — H905 Unspecified sensorineural hearing loss: Secondary | ICD-10-CM | POA: Diagnosis not present

## 2022-03-16 DIAGNOSIS — H35033 Hypertensive retinopathy, bilateral: Secondary | ICD-10-CM | POA: Diagnosis not present

## 2022-03-24 DIAGNOSIS — E538 Deficiency of other specified B group vitamins: Secondary | ICD-10-CM | POA: Diagnosis not present

## 2022-03-24 DIAGNOSIS — L988 Other specified disorders of the skin and subcutaneous tissue: Secondary | ICD-10-CM | POA: Diagnosis not present

## 2022-03-24 DIAGNOSIS — E1169 Type 2 diabetes mellitus with other specified complication: Secondary | ICD-10-CM | POA: Diagnosis not present

## 2022-03-24 DIAGNOSIS — E785 Hyperlipidemia, unspecified: Secondary | ICD-10-CM | POA: Diagnosis not present

## 2022-03-24 DIAGNOSIS — D485 Neoplasm of uncertain behavior of skin: Secondary | ICD-10-CM | POA: Diagnosis not present

## 2022-03-24 DIAGNOSIS — I872 Venous insufficiency (chronic) (peripheral): Secondary | ICD-10-CM | POA: Diagnosis not present

## 2022-03-24 DIAGNOSIS — I10 Essential (primary) hypertension: Secondary | ICD-10-CM | POA: Diagnosis not present

## 2022-03-24 DIAGNOSIS — G4700 Insomnia, unspecified: Secondary | ICD-10-CM | POA: Diagnosis not present

## 2022-03-26 DIAGNOSIS — E1169 Type 2 diabetes mellitus with other specified complication: Secondary | ICD-10-CM | POA: Diagnosis not present

## 2022-04-30 DIAGNOSIS — M779 Enthesopathy, unspecified: Secondary | ICD-10-CM | POA: Diagnosis not present

## 2022-04-30 DIAGNOSIS — T148XXA Other injury of unspecified body region, initial encounter: Secondary | ICD-10-CM | POA: Diagnosis not present

## 2022-10-06 DIAGNOSIS — E538 Deficiency of other specified B group vitamins: Secondary | ICD-10-CM | POA: Diagnosis not present

## 2022-10-06 DIAGNOSIS — E785 Hyperlipidemia, unspecified: Secondary | ICD-10-CM | POA: Diagnosis not present

## 2022-10-06 DIAGNOSIS — G4700 Insomnia, unspecified: Secondary | ICD-10-CM | POA: Diagnosis not present

## 2022-10-06 DIAGNOSIS — Z23 Encounter for immunization: Secondary | ICD-10-CM | POA: Diagnosis not present

## 2022-10-06 DIAGNOSIS — G4733 Obstructive sleep apnea (adult) (pediatric): Secondary | ICD-10-CM | POA: Diagnosis not present

## 2022-10-06 DIAGNOSIS — I10 Essential (primary) hypertension: Secondary | ICD-10-CM | POA: Diagnosis not present

## 2022-10-06 DIAGNOSIS — M150 Primary generalized (osteo)arthritis: Secondary | ICD-10-CM | POA: Diagnosis not present

## 2022-10-06 DIAGNOSIS — E1169 Type 2 diabetes mellitus with other specified complication: Secondary | ICD-10-CM | POA: Diagnosis not present

## 2022-10-06 DIAGNOSIS — Z0000 Encounter for general adult medical examination without abnormal findings: Secondary | ICD-10-CM | POA: Diagnosis not present

## 2022-10-07 ENCOUNTER — Other Ambulatory Visit: Payer: Self-pay | Admitting: Family Medicine

## 2022-10-07 DIAGNOSIS — E2839 Other primary ovarian failure: Secondary | ICD-10-CM

## 2022-10-22 ENCOUNTER — Other Ambulatory Visit: Payer: Self-pay | Admitting: Family Medicine

## 2022-10-22 DIAGNOSIS — Z1231 Encounter for screening mammogram for malignant neoplasm of breast: Secondary | ICD-10-CM

## 2022-11-11 DIAGNOSIS — I10 Essential (primary) hypertension: Secondary | ICD-10-CM | POA: Diagnosis not present

## 2022-11-11 DIAGNOSIS — G4733 Obstructive sleep apnea (adult) (pediatric): Secondary | ICD-10-CM | POA: Diagnosis not present

## 2022-12-18 ENCOUNTER — Ambulatory Visit: Payer: Medicare Other

## 2023-02-02 DIAGNOSIS — D1801 Hemangioma of skin and subcutaneous tissue: Secondary | ICD-10-CM | POA: Diagnosis not present

## 2023-02-02 DIAGNOSIS — D2262 Melanocytic nevi of left upper limb, including shoulder: Secondary | ICD-10-CM | POA: Diagnosis not present

## 2023-02-02 DIAGNOSIS — D2272 Melanocytic nevi of left lower limb, including hip: Secondary | ICD-10-CM | POA: Diagnosis not present

## 2023-02-02 DIAGNOSIS — D225 Melanocytic nevi of trunk: Secondary | ICD-10-CM | POA: Diagnosis not present

## 2023-02-02 DIAGNOSIS — L570 Actinic keratosis: Secondary | ICD-10-CM | POA: Diagnosis not present

## 2023-02-02 DIAGNOSIS — D485 Neoplasm of uncertain behavior of skin: Secondary | ICD-10-CM | POA: Diagnosis not present

## 2023-02-02 DIAGNOSIS — D2261 Melanocytic nevi of right upper limb, including shoulder: Secondary | ICD-10-CM | POA: Diagnosis not present

## 2023-02-02 DIAGNOSIS — L814 Other melanin hyperpigmentation: Secondary | ICD-10-CM | POA: Diagnosis not present

## 2023-02-02 DIAGNOSIS — Z85820 Personal history of malignant melanoma of skin: Secondary | ICD-10-CM | POA: Diagnosis not present

## 2023-02-02 DIAGNOSIS — L821 Other seborrheic keratosis: Secondary | ICD-10-CM | POA: Diagnosis not present

## 2023-02-02 DIAGNOSIS — D2372 Other benign neoplasm of skin of left lower limb, including hip: Secondary | ICD-10-CM | POA: Diagnosis not present

## 2023-02-02 DIAGNOSIS — D2271 Melanocytic nevi of right lower limb, including hip: Secondary | ICD-10-CM | POA: Diagnosis not present

## 2023-02-11 ENCOUNTER — Ambulatory Visit
Admission: RE | Admit: 2023-02-11 | Discharge: 2023-02-11 | Disposition: A | Discharge status: Home / Self Care | Payer: Medicare Other | Source: Ambulatory Visit | Attending: Family Medicine | Admitting: Family Medicine

## 2023-02-11 DIAGNOSIS — Z1231 Encounter for screening mammogram for malignant neoplasm of breast: Secondary | ICD-10-CM

## 2023-04-06 DIAGNOSIS — E119 Type 2 diabetes mellitus without complications: Secondary | ICD-10-CM | POA: Diagnosis not present

## 2023-04-06 DIAGNOSIS — E1169 Type 2 diabetes mellitus with other specified complication: Secondary | ICD-10-CM | POA: Diagnosis not present

## 2023-04-06 DIAGNOSIS — G4700 Insomnia, unspecified: Secondary | ICD-10-CM | POA: Diagnosis not present

## 2023-04-06 DIAGNOSIS — E538 Deficiency of other specified B group vitamins: Secondary | ICD-10-CM | POA: Diagnosis not present

## 2023-04-06 DIAGNOSIS — M62838 Other muscle spasm: Secondary | ICD-10-CM | POA: Diagnosis not present

## 2023-04-06 DIAGNOSIS — G4733 Obstructive sleep apnea (adult) (pediatric): Secondary | ICD-10-CM | POA: Diagnosis not present

## 2023-04-06 DIAGNOSIS — I10 Essential (primary) hypertension: Secondary | ICD-10-CM | POA: Diagnosis not present

## 2023-04-06 DIAGNOSIS — Z9989 Dependence on other enabling machines and devices: Secondary | ICD-10-CM | POA: Diagnosis not present

## 2023-04-06 DIAGNOSIS — E785 Hyperlipidemia, unspecified: Secondary | ICD-10-CM | POA: Diagnosis not present

## 2023-04-12 ENCOUNTER — Inpatient Hospital Stay: Admission: RE | Admit: 2023-04-12 | Payer: Medicare Other | Source: Ambulatory Visit

## 2023-04-27 DIAGNOSIS — H903 Sensorineural hearing loss, bilateral: Secondary | ICD-10-CM | POA: Diagnosis not present

## 2023-05-18 DIAGNOSIS — M79631 Pain in right forearm: Secondary | ICD-10-CM | POA: Diagnosis not present

## 2023-05-18 DIAGNOSIS — M25512 Pain in left shoulder: Secondary | ICD-10-CM | POA: Diagnosis not present

## 2023-05-31 DIAGNOSIS — H35033 Hypertensive retinopathy, bilateral: Secondary | ICD-10-CM | POA: Diagnosis not present

## 2023-06-01 DIAGNOSIS — H903 Sensorineural hearing loss, bilateral: Secondary | ICD-10-CM | POA: Diagnosis not present

## 2023-06-18 ENCOUNTER — Telehealth: Payer: Self-pay

## 2023-06-18 NOTE — Telephone Encounter (Signed)
Requested a copy of audiology results. Kristina Perkins will be faxing.  Patient has an appointment with Dr. Irene Pap 06/21/2023

## 2023-06-21 ENCOUNTER — Ambulatory Visit (INDEPENDENT_AMBULATORY_CARE_PROVIDER_SITE_OTHER): Payer: Medicare Other | Admitting: Otolaryngology

## 2023-06-21 ENCOUNTER — Encounter (INDEPENDENT_AMBULATORY_CARE_PROVIDER_SITE_OTHER): Payer: Self-pay | Admitting: Otolaryngology

## 2023-06-21 VITALS — BP 131/76 | HR 77 | Ht 65.0 in | Wt 200.0 lb

## 2023-06-21 DIAGNOSIS — H903 Sensorineural hearing loss, bilateral: Secondary | ICD-10-CM

## 2023-06-21 DIAGNOSIS — H6122 Impacted cerumen, left ear: Secondary | ICD-10-CM

## 2023-06-21 DIAGNOSIS — T162XXA Foreign body in left ear, initial encounter: Secondary | ICD-10-CM

## 2023-06-21 NOTE — Progress Notes (Signed)
ENT CONSULT:  Reason for Consult: asymmetric hearing loss, sent by Audiology  HPI: Kristina Perkins is an 77 y.o. female with hx of T2DM, hx of SNHL b/l, using amplification, with hearing changes more recently, on the left side. She noticed a few months ago that she had to turn up the TV volume and had an Audio at her usual audiology center, where she was noted to have slight asymmetry between her left (worse hearing) and right ear. She was sent to [place] for evaluation. No ear pain, no dizziness/vertigo. No recent noise exposure. She does have sensation of ear fullness on the left side. No ear surgeries she wears hearing aids right now, denies issues with hearing aids.     Records Reviewed:  Hx based on referring provider documentation  Serial Audiograms  1st from [facility] 02/2022 B/l symmetric SNHL mild to moderate most frequencies and more significant in high-freq, WRS 96% on the right and 92% on the left, Type A typms  2nd from [facility] 04/2023 Overall symmetric SNHL b/l slightly worse on the left (difference is less than 10 dB across all frequencies), slightly worse compared to one done 02/2022, overall similar, WDS 100% on the right and 92% on the left copy of tymps not provided    Past Medical History:  Diagnosis Date   Asthma    hx of   Cataracts, bilateral    Degenerative joint disease    Depression    Diabetes mellitus    "I'm diabetic when I'm pregnant"   Difficult intubation    "I am a terrible intubation; took over 45 min 11/03/11"   DJD (degenerative joint disease)    Fracture of left clavicle 1974   Fracture, sternum closed 1974   Hypertension    Melanoma (HCC) 1990   "on my back"   Multiple closed fractures of ribs of left side 1974   Osteoarthritis of knee    "some of my joints"   Pneumonia    no recent history of   Pneumothorax, traumatic 1974   Sleep apnea    uses CPAP sleep study done at [facility]   SOB (shortness of breath) on  exertion    Traumatic injury of the kidney    38 years ago    Past Surgical History:  Procedure Laterality Date   ADENOIDECTOMY     2nd time   CARPAL TUNNEL RELEASE Right 02/22/2014   Procedure: RIGHT CARPAL TUNNEL RELEASE;  Surgeon: Tami Ribas, MD;  Location: [facility];  Service: Orthopedics;  Laterality: Right;   CARPAL TUNNEL RELEASE Left 07/26/2014   Procedure: LEFT CARPAL TUNNEL RELEASE;  Surgeon: Betha Loa, MD;  Location: [facility];  Service: Orthopedics;  Laterality: Left;   CESAREAN SECTION  1975   CHOLECYSTECTOMY  ~ 1999   SHOULDER ARTHROSCOPY  2006   left   TONSILLECTOMY AND ADENOIDECTOMY     "as a child"   TOTAL KNEE ARTHROPLASTY  11/04/11   left   TOTAL KNEE ARTHROPLASTY  11/03/2011   Procedure: TOTAL KNEE ARTHROPLASTY;  Surgeon: Velna Ochs, MD;  Location: [facility];  Service: Orthopedics;  Laterality: Left;  Primary left total knee with revision components    TOTAL KNEE ARTHROPLASTY  08/16/2012   Procedure: TOTAL KNEE ARTHROPLASTY;  Surgeon: Velna Ochs, MD;  Location: [facility];  Service: Orthopedics;  Laterality: Right;  Difficult Airway Awake Intubation per Anesthesia    Family History  Problem Relation Age of Onset  Anesthesia problems Mother     Social History:  reports that she has never smoked. She has never used smokeless tobacco. She reports current alcohol use. She reports that she does not use drugs.  Allergies:  Allergies  Allergen Reactions   Ambien [Zolpidem] Other (See Comments)   Sulfa Antibiotics Other (See Comments)    Blood in urine    Medications: I have reviewed the patient's current medications.  The PMH, PSH, Medications, Allergies, and SH were reviewed and updated.  ROS: Constitutional: Negative for fever, weight loss and weight gain. Cardiovascular: Negative for chest pain and dyspnea on exertion. Respiratory: Is not experiencing shortness of breath at rest. Gastrointestinal: Negative for  nausea and vomiting. Neurological: Negative for headaches. Psychiatric: The patient is not nervous/anxious  Blood pressure 131/76, pulse 77, height 5\' 5"  (1.651 m), weight 200 lb (90.7 kg), SpO2 99%.  PHYSICAL EXAM:  Exam: General: Well-developed, well-nourished Communication and Voice: Clear pitch and clarity Respiratory Respiratory effort: Equal inspiration and expiration without stridor Cardiovascular Peripheral Vascular: Warm extremities with equal color/perfusion Eyes: No nystagmus with equal extraocular motion bilaterally Neuro/Psych/Balance: Patient oriented to person, place, and time; Appropriate mood and affect; Gait is intact with no imbalance; Cranial nerves I-XII are intact Head and Face Inspection: Normocephalic and atraumatic without mass or lesion Palpation: Facial skeleton intact without bony stepoffs Salivary Glands: No mass or tenderness Facial Strength: Facial motility symmetric and full bilaterally ENT Pinna: External ear intact and fully developed External canal: Canal is patent with intact skin Tympanic Membrane: Clear and mobile External Nose: No scar or anatomic deformity Lips, Teeth, and gums: Mucosa and teeth intact and viable TMJ: No pain to palpation with full mobility Oral cavity/oropharynx: No erythema or exudate, no lesions present Neck Neck and Trachea: Midline trachea without mass or lesion Thyroid: No mass or nodularity Lymphatics: No lymphadenopathy  Procedure:  Procedure: Cerumen Removal, left (CPT H3160753) and ear foreign body removal, left (CPT 69200)  Diagnosis: cerumen impaction, left  Informed consent: Timeout performed and informed consent was obtained.  Procedure: Operating microscope was employed to evaluate the ear(s).  Cerumen curette, speculum and suction were employed to clear the cerumen. Of note, there was evidence of foreign body deep in left ear canal (cotton from Qtip) which was removed using Alligator.   Findings: Normal  appearing tympanic membrane on the left without perforations, and external canals are normal after removal of cerumen.No middle ear fluid bilaterally.   Complications: None. Patient tolerated well.     Studies Reviewed:no imaging to review   Assessment/Plan: Encounter Diagnoses  Name Primary?   Impacted cerumen of left ear Yes   Ear foreign body, left, initial encounter    Sensorineural hearing loss (SNHL) of both ears     77 year old female with history of type 2 diabetes and known history of bilateral sensorineural hearing loss, who was recently seen by audiology due to decrease in her hearing on the left side, with sensation of muffled hearing and ear fullness.  She was previously given hearing aids and feels that they are working for her.  Main concern and reason for evaluation was slight asymmetry on audiogram obtained this year relative to last year.  I reviewed audiology results for both 2023 and 2024, and the differences less than 10 dB across all frequencies, in fact when all frequencies are compared the results on both audiograms appear relatively similar.  Since the difference between the two ears is less than 15 dB and she does not endorse any  additional symptoms such as dizziness or vertigo, there is no indication for screening MRI IAC at this time.  We also removed cerumen and foreign body from her left ear canal today (piece of cotton from a Q-tip).  I discussed my recommendations with the patient and also advised her not to use Q-tips in your ears.  She will follow-up with [place] as needed, and will continue to see her audiologist for hearing aid needs (screening annual audiograms), will consider imaging if future testing will reveal significant asymmetry between the right and left side or if she develops new symptoms.   Thank you for allowing me to participate in the care of this patient. Please do not hesitate to contact me with any questions or concerns.   Ashok Croon,  MD Otolaryngology [facility] Phone: [phone] Fax: [phone]    06/21/2023, 9:31 PM

## 2023-08-16 IMAGING — DX DG ANKLE COMPLETE 3+V*L*
3 series · 3 of 3 positions shown · non-contrast
Comparison: None.

CLINICAL DATA: Injury

EXAM:
LEFT ANKLE COMPLETE - 3+ VIEW

[ankle ap]
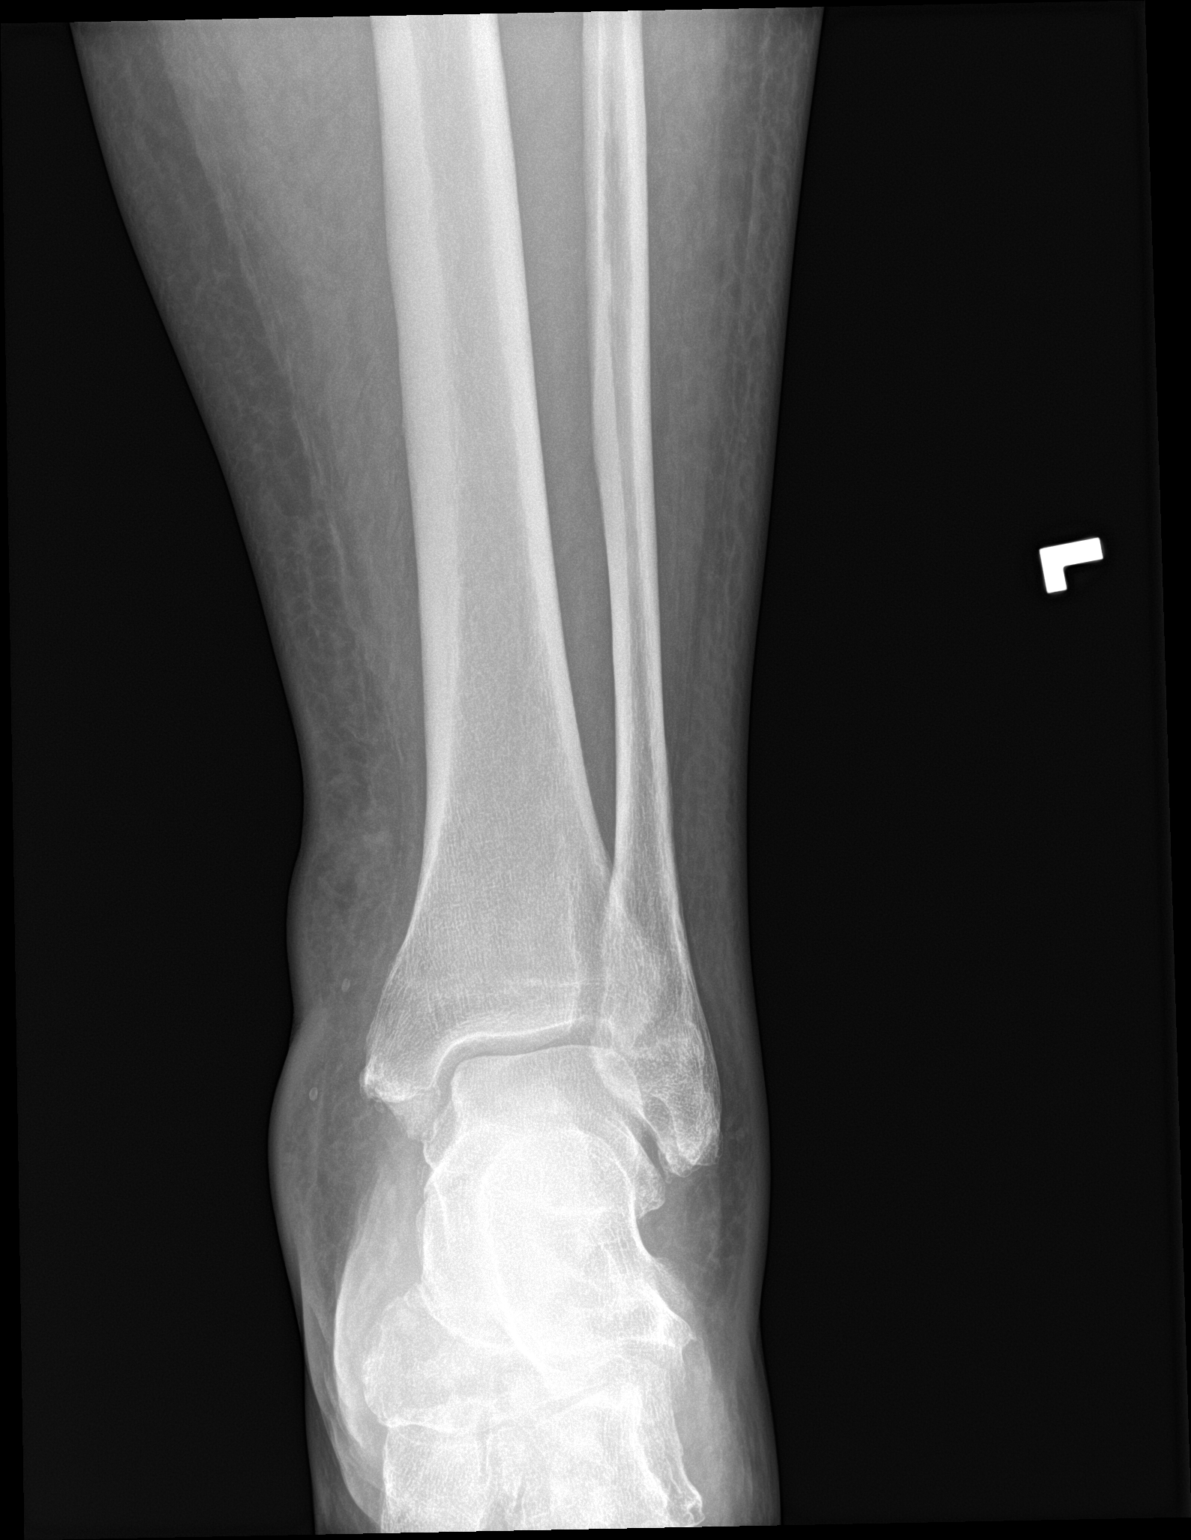

[ankle obl]
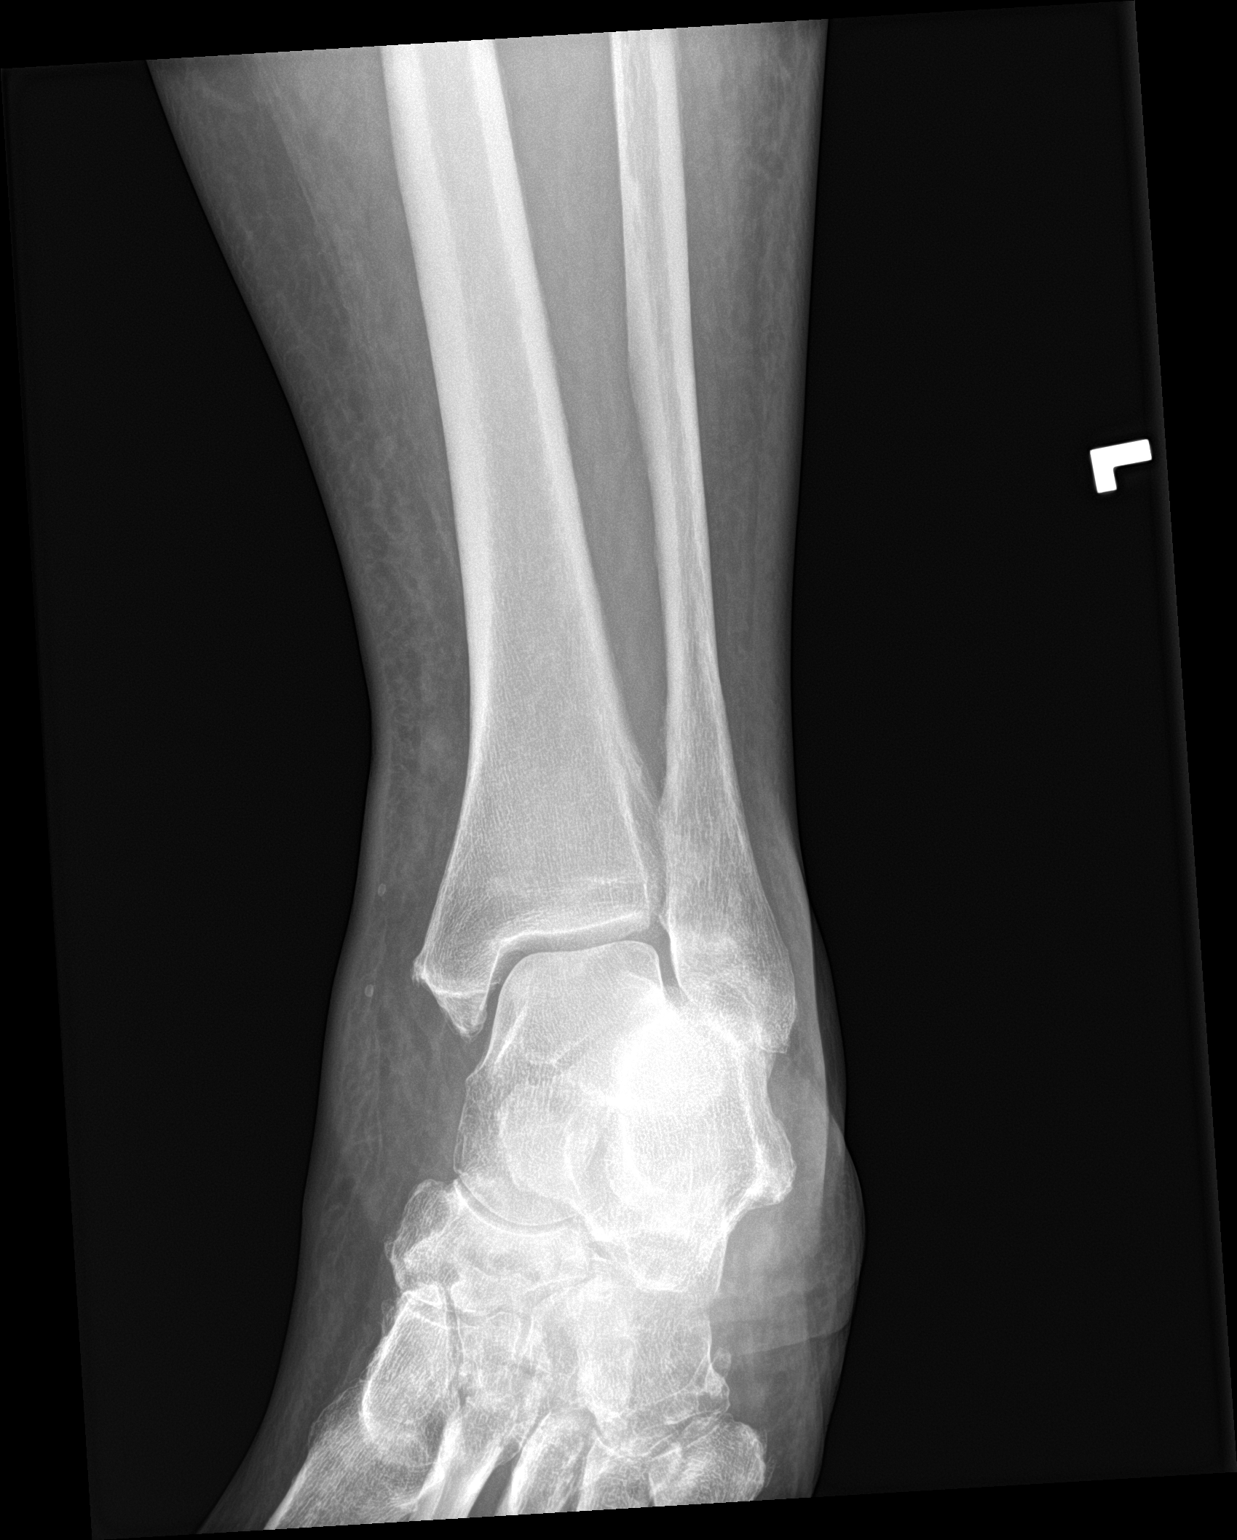

[ankle lat]
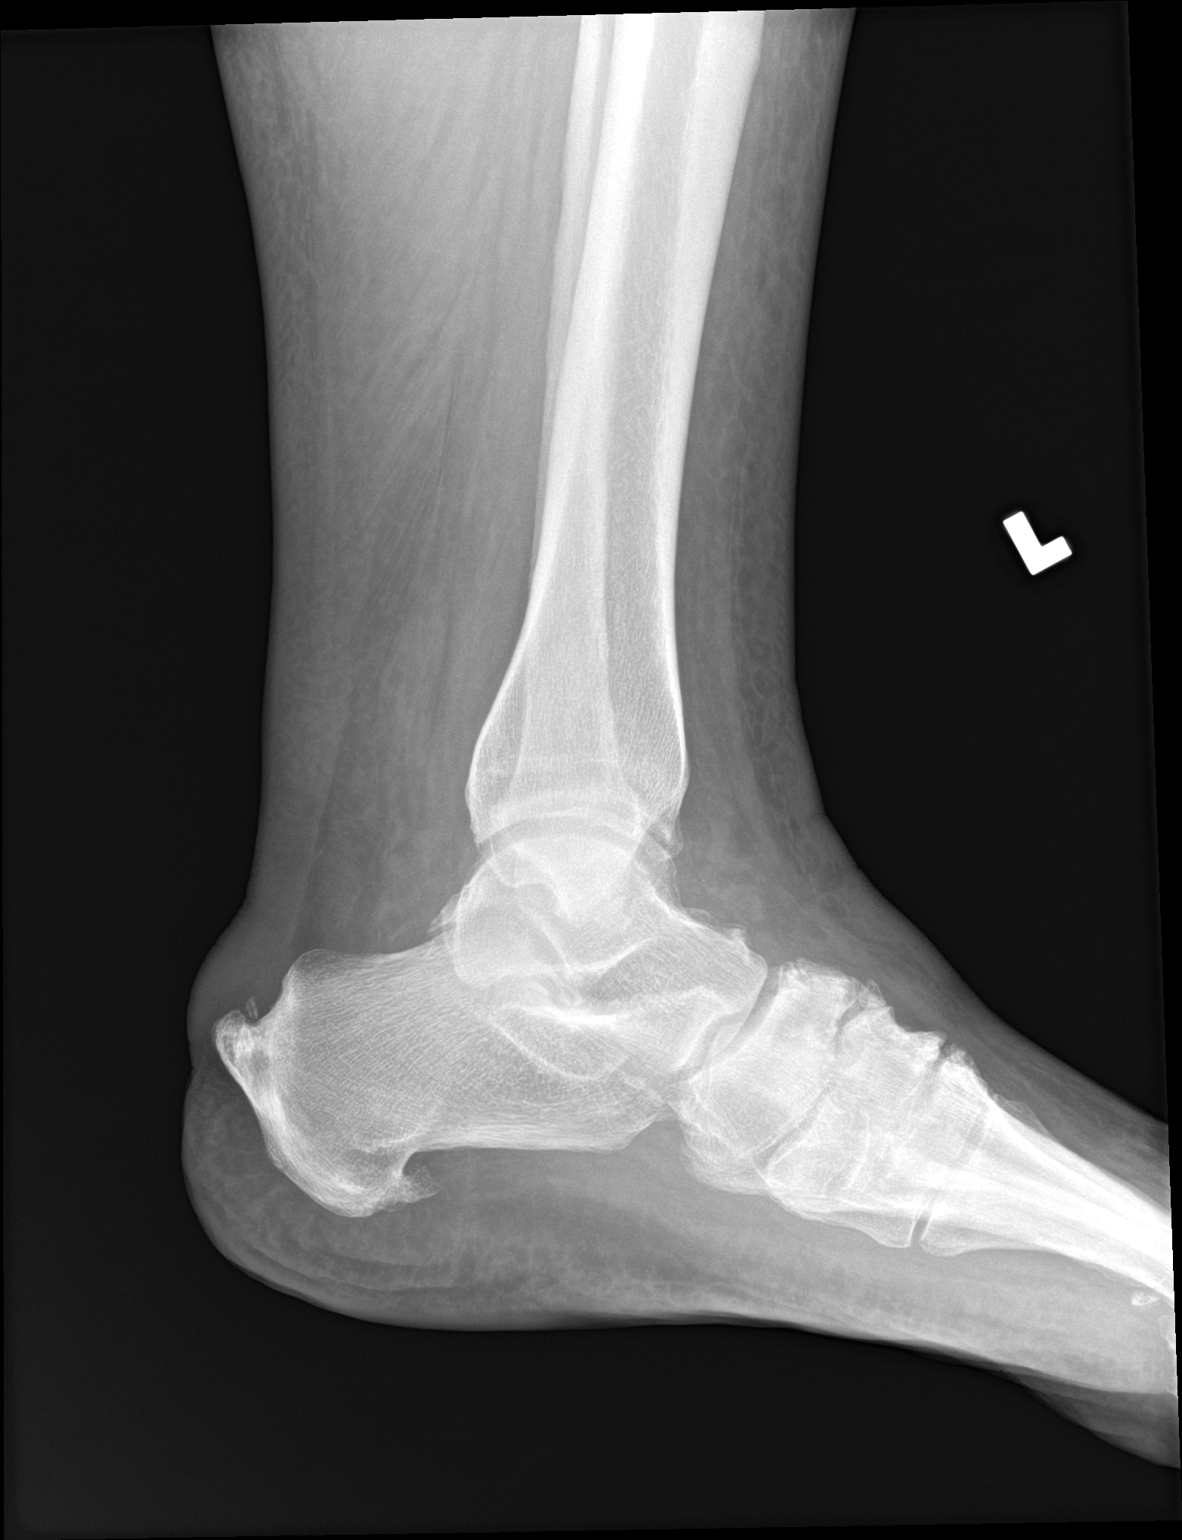

[3 of 3 positions shown; findings below may reference images not displayed]

FINDINGS: There is ankle soft tissue swelling. There is no evidence of acute
fracture. Probable tibiotalar joint effusion. Alignment is normal.
There is moderate midfoot osteoarthritis. There are prominent
plantar and dorsal calcaneal spurs.
IMPRESSION: Ankle soft tissue swelling and probable tibiotalar joint effusion.
No visible fracture.

## 2023-10-29 ENCOUNTER — Other Ambulatory Visit: Payer: Self-pay | Admitting: Family Medicine

## 2023-10-29 DIAGNOSIS — G4733 Obstructive sleep apnea (adult) (pediatric): Secondary | ICD-10-CM | POA: Diagnosis not present

## 2023-10-29 DIAGNOSIS — E2839 Other primary ovarian failure: Secondary | ICD-10-CM

## 2023-10-29 DIAGNOSIS — D649 Anemia, unspecified: Secondary | ICD-10-CM | POA: Diagnosis not present

## 2023-10-29 DIAGNOSIS — E538 Deficiency of other specified B group vitamins: Secondary | ICD-10-CM | POA: Diagnosis not present

## 2023-10-29 DIAGNOSIS — I10 Essential (primary) hypertension: Secondary | ICD-10-CM | POA: Diagnosis not present

## 2023-10-29 DIAGNOSIS — Z85820 Personal history of malignant melanoma of skin: Secondary | ICD-10-CM | POA: Diagnosis not present

## 2023-10-29 DIAGNOSIS — E785 Hyperlipidemia, unspecified: Secondary | ICD-10-CM | POA: Diagnosis not present

## 2023-10-29 DIAGNOSIS — G4700 Insomnia, unspecified: Secondary | ICD-10-CM | POA: Diagnosis not present

## 2023-10-29 DIAGNOSIS — Z23 Encounter for immunization: Secondary | ICD-10-CM | POA: Diagnosis not present

## 2023-10-29 DIAGNOSIS — M150 Primary generalized (osteo)arthritis: Secondary | ICD-10-CM | POA: Diagnosis not present

## 2023-10-29 DIAGNOSIS — Z0000 Encounter for general adult medical examination without abnormal findings: Secondary | ICD-10-CM | POA: Diagnosis not present

## 2023-10-29 DIAGNOSIS — E119 Type 2 diabetes mellitus without complications: Secondary | ICD-10-CM | POA: Diagnosis not present

## 2023-12-22 DIAGNOSIS — G4733 Obstructive sleep apnea (adult) (pediatric): Secondary | ICD-10-CM | POA: Diagnosis not present

## 2024-01-04 DIAGNOSIS — R0683 Snoring: Secondary | ICD-10-CM | POA: Diagnosis not present

## 2024-01-04 DIAGNOSIS — G4733 Obstructive sleep apnea (adult) (pediatric): Secondary | ICD-10-CM | POA: Diagnosis not present

## 2024-01-17 DIAGNOSIS — G478 Other sleep disorders: Secondary | ICD-10-CM | POA: Diagnosis not present

## 2024-02-04 DIAGNOSIS — Z85820 Personal history of malignant melanoma of skin: Secondary | ICD-10-CM | POA: Diagnosis not present

## 2024-02-04 DIAGNOSIS — L821 Other seborrheic keratosis: Secondary | ICD-10-CM | POA: Diagnosis not present

## 2024-02-04 DIAGNOSIS — D2271 Melanocytic nevi of right lower limb, including hip: Secondary | ICD-10-CM | POA: Diagnosis not present

## 2024-02-04 DIAGNOSIS — D1801 Hemangioma of skin and subcutaneous tissue: Secondary | ICD-10-CM | POA: Diagnosis not present

## 2024-02-04 DIAGNOSIS — D2261 Melanocytic nevi of right upper limb, including shoulder: Secondary | ICD-10-CM | POA: Diagnosis not present

## 2024-02-04 DIAGNOSIS — D2272 Melanocytic nevi of left lower limb, including hip: Secondary | ICD-10-CM | POA: Diagnosis not present

## 2024-02-04 DIAGNOSIS — L905 Scar conditions and fibrosis of skin: Secondary | ICD-10-CM | POA: Diagnosis not present

## 2024-02-04 DIAGNOSIS — D485 Neoplasm of uncertain behavior of skin: Secondary | ICD-10-CM | POA: Diagnosis not present

## 2024-02-04 DIAGNOSIS — D225 Melanocytic nevi of trunk: Secondary | ICD-10-CM | POA: Diagnosis not present

## 2024-04-28 DIAGNOSIS — M25512 Pain in left shoulder: Secondary | ICD-10-CM | POA: Diagnosis not present

## 2024-04-28 DIAGNOSIS — E785 Hyperlipidemia, unspecified: Secondary | ICD-10-CM | POA: Diagnosis not present

## 2024-04-28 DIAGNOSIS — M25511 Pain in right shoulder: Secondary | ICD-10-CM | POA: Diagnosis not present

## 2024-04-28 DIAGNOSIS — I10 Essential (primary) hypertension: Secondary | ICD-10-CM | POA: Diagnosis not present

## 2024-04-28 DIAGNOSIS — K5903 Drug induced constipation: Secondary | ICD-10-CM | POA: Diagnosis not present

## 2024-04-28 DIAGNOSIS — G4700 Insomnia, unspecified: Secondary | ICD-10-CM | POA: Diagnosis not present

## 2024-04-28 DIAGNOSIS — E119 Type 2 diabetes mellitus without complications: Secondary | ICD-10-CM | POA: Diagnosis not present

## 2024-06-26 ENCOUNTER — Other Ambulatory Visit: Payer: Medicare Other

## 2024-08-09 ENCOUNTER — Other Ambulatory Visit (HOSPITAL_BASED_OUTPATIENT_CLINIC_OR_DEPARTMENT_OTHER)

## 2024-11-24 ENCOUNTER — Emergency Department (HOSPITAL_BASED_OUTPATIENT_CLINIC_OR_DEPARTMENT_OTHER)

## 2024-11-24 ENCOUNTER — Emergency Department (HOSPITAL_BASED_OUTPATIENT_CLINIC_OR_DEPARTMENT_OTHER): Admitting: Radiology

## 2024-11-24 ENCOUNTER — Emergency Department (HOSPITAL_BASED_OUTPATIENT_CLINIC_OR_DEPARTMENT_OTHER)
Admission: EM | Admit: 2024-11-24 | Discharge: 2024-11-24 | Disposition: A | Discharge status: Home / Self Care | Source: Home / Self Care | Attending: Emergency Medicine | Admitting: Emergency Medicine

## 2024-11-24 ENCOUNTER — Other Ambulatory Visit: Payer: Self-pay

## 2024-11-24 DIAGNOSIS — H5712 Ocular pain, left eye: Secondary | ICD-10-CM | POA: Diagnosis present

## 2024-11-24 DIAGNOSIS — W010XXA Fall on same level from slipping, tripping and stumbling without subsequent striking against object, initial encounter: Secondary | ICD-10-CM | POA: Insufficient documentation

## 2024-11-24 DIAGNOSIS — S0012XA Contusion of left eyelid and periocular area, initial encounter: Secondary | ICD-10-CM | POA: Insufficient documentation

## 2024-11-24 DIAGNOSIS — S0990XA Unspecified injury of head, initial encounter: Secondary | ICD-10-CM | POA: Diagnosis not present

## 2024-11-24 DIAGNOSIS — I10 Essential (primary) hypertension: Secondary | ICD-10-CM | POA: Diagnosis not present

## 2024-11-24 DIAGNOSIS — W19XXXA Unspecified fall, initial encounter: Secondary | ICD-10-CM

## 2024-11-24 DIAGNOSIS — Z7984 Long term (current) use of oral hypoglycemic drugs: Secondary | ICD-10-CM | POA: Diagnosis not present

## 2024-11-24 DIAGNOSIS — E119 Type 2 diabetes mellitus without complications: Secondary | ICD-10-CM | POA: Diagnosis not present

## 2024-11-24 NOTE — ED Triage Notes (Signed)
 Pt via pov from home after fall this afternoon. Pt states she tripped while trying to go through the door at home. Pt has swelling above her left eye, pain in both shins. Denies blood thinner use. Denies LOC. Pt a&o x 4; nad noted.

## 2024-11-24 NOTE — Discharge Instructions (Addendum)
 Your CT scans and x-rays today did not show any evidence of fracture/dislocation/intracranial injury as a result of your fall today.  Continue to ice your left eye to help improve swelling/bruising.  Continue Tylenol  as needed for pain/soreness.  Return to the emergency department if your symptoms worsen, follow-up with your PCP as needed.

## 2024-11-24 NOTE — ED Provider Notes (Signed)
 " [place] EMERGENCY DEPARTMENT AT [facility] Provider Note   CSN: [id number] Arrival date & time: 11/24/24  1742     Patient presents with: Kristina Perkins is a 79 y.o. female.   76 female presenting after a fall.  Patient stepped out on her porch to retrieve an [employer] ~5:30pm, when Kristina Perkins turned around her feet caught against the door frame causing her to fall forward, with the left side of her face striking the floor.  Kristina Perkins complains of discomfort superior to her left eye as well as into her shins bilaterally, history of knee replacements bilaterally.  Denies loss of consciousness, no use of blood thinners.  No vision changes, no eye pain or pain with EOM's.   Fall       Prior to Admission medications  Medication Sig Start Date End Date Taking? Authorizing Provider  Acetaminophen  (TYLENOL  8 HOUR ARTHRITIS PAIN PO) Take by mouth.    [provider]  ALPRAZolam  (XANAX ) 0.25 MG tablet Take 0.25 mg by mouth 3 (three) times daily as needed. For anxiety Patient not taking: Reported on 06/21/2023    [provider]  cephALEXin  (KEFLEX ) 500 MG capsule Take 1 capsule (500 mg total) by mouth 3 (three) times daily. 02/23/22   Lenor Hollering, MD  glucose monitoring kit (FREESTYLE) monitoring kit 1 each by Does not apply route as needed for other.    [provider]  hydrochlorothiazide  (HYDRODIURIL ) 25 MG tablet Take 25 mg by mouth daily as needed. Patient not taking: Reported on 06/21/2023    [provider]  Lancets (ACCU-CHEK SOFT TOUCH) lancets 1 each by Other route as needed for other. Use as instructed Patient not taking: Reported on 06/21/2023    [provider]  losartan -hydrochlorothiazide  (HYZAAR) 50-12.5 MG per tablet Take 1 tablet by mouth daily.    [provider]  meloxicam (MOBIC) 15 MG tablet Take 15 mg by mouth daily. Patient not taking: Reported on 06/21/2023    [provider]  metFORMIN  (GLUMETZA) 500 MG (MOD) 24 hr tablet Take 1,000 mg by mouth daily after supper.    [provider]  methocarbamol  (ROBAXIN ) 500 MG tablet Take 500 mg by mouth 4 (four) times daily. Patient not taking: Reported on 06/21/2023    [provider]  rosuvastatin (CRESTOR) 5 MG tablet Take by mouth.    [provider]  Semaglutide, 1 MG/DOSE, (OZEMPIC, 1 MG/DOSE,) 4 MG/3ML SOPN Inject into the skin.    [provider]  tiZANidine (ZANAFLEX) 2 MG tablet  05/02/18   [provider]  valsartan-hydrochlorothiazide  (DIOVAN-HCT) 80-12.5 MG tablet Take 1 tablet by mouth daily. 04/06/23   [provider]    Allergies: Ambien  [zolpidem ] and Sulfa antibiotics    Review of Systems  Updated Vital Signs  Vitals:   11/24/24 1749 11/24/24 1750  BP: (!) 164/79   Pulse: 73   Resp: 20   Temp: 97.8 F (36.6 C)   TempSrc: Skin   SpO2: 98%   Weight:  85.3 kg  Height:  5' 4 (1.626 m)     Physical Exam Vitals and nursing note reviewed.  HENT:     Head:      Comments: Bruising noted to the left upper eyelid with associated swelling/contusion formation along the ridge of the brow, ~1cm abrasion noted to the outer brow edge that is not actively bleeding No appreciable facial bony deformity No Battle sign Eyes:     Extraocular Movements:  Extraocular movements intact.     Conjunctiva/sclera: Conjunctivae normal.     Pupils: Pupils are equal, round, and reactive to light.  Cardiovascular:     Rate and Rhythm: Normal rate.  Pulmonary:     Effort: Pulmonary effort is normal.  Musculoskeletal:     Cervical back: Normal range of motion and neck supple. No tenderness.     Comments: Back: No midline spinous process tenderness to palpation LE's: Mild bruising noted to the right shin with tenderness on exam of the mid right shin and inferior portion of the left knee, no obvious bony deformity, no TTP of medial/lateral malleolus bilaterally, full active/passive  ROM of hips/knees/ankles  Skin:    General: Skin is warm and dry.  Neurological:     Mental Status: Kristina Perkins is alert and oriented to person, place, and time.     (all labs ordered are listed, but only abnormal results are displayed) Labs Reviewed - No data to display  EKG: None  Radiology: DG Tibia/Fibula Right Result Date: 11/24/2024 EXAM: 7 VIEW(S) XRAY OF THE RIGHT TIBIA AND FIBULA 11/24/2024 08:45:19 PM COMPARISON: None available. CLINICAL HISTORY: fall fall FINDINGS: BONES AND JOINTS: No acute bony abnormality. Prior right knee replacement. No hardware complicating feature. SOFT TISSUES: The soft tissues are unremarkable. IMPRESSION: 1. No acute bony abnormality. Electronically signed by: Franky Crease MD 11/24/2024 09:37 PM EST RP Workstation: [id number]   DG Knee 2 Views Left Result Date: 11/24/2024 EXAM: 1 OR 2 VIEW(S) XRAY OF THE LEFT KNEE 11/24/2024 08:45:19 PM COMPARISON: 05/25/2016 CLINICAL HISTORY: fall FINDINGS: BONES AND JOINTS: Prior left knee replacement. No hardware complicating feature. No acute bony abnormality. No malalignment. No significant joint effusion. SOFT TISSUES: The soft tissues are unremarkable. IMPRESSION: 1. No acute bony abnormality. Electronically signed by: Franky Crease MD 11/24/2024 09:36 PM EST RP Workstation: [id number]   DG Knee 2 Views Right Result Date: 11/24/2024 EXAM: 1 OR 2 VIEW(S) XRAY OF THE RIGHT KNEE 11/24/2024 08:45:19 PM COMPARISON: 05/25/2016 CLINICAL HISTORY: fall FINDINGS: BONES AND JOINTS: Prior right knee replacement. No hardware complicating feature. No acute fracture. No malalignment. No significant joint effusion. SOFT TISSUES: The soft tissues are unremarkable. IMPRESSION: 1. No acute bony abnormality Electronically signed by: Franky Crease MD 11/24/2024 09:35 PM EST RP Workstation: [id number]   DG Tibia/Fibula Left Result Date: 11/24/2024 EXAM: VIEW(S) XRAY OF THE LEFT TIBIA AND FIBULA 11/24/2024 08:45:19 PM COMPARISON: Knee series 05/25/2016.  CLINICAL HISTORY: fall FINDINGS: BONES AND JOINTS: Prior left knee replacement. No hardware complicating feature. No acute fracture. No malalignment. SOFT TISSUES: The soft tissues are unremarkable. IMPRESSION: 1. No acute fracture or dislocation. Electronically signed by: Franky Crease MD 11/24/2024 09:34 PM EST RP Workstation: [id number]   CT Maxillofacial Wo Contrast Result Date: 11/24/2024 EXAM: CT OF THE FACE WITHOUT CONTRAST 11/24/2024 08:33:35 PM TECHNIQUE: CT of the face was performed without the administration of intravenous contrast. Multiplanar reformatted images are provided for review. Automated exposure control, iterative reconstruction, and/or weight based adjustment of the mA/kV was utilized to reduce the radiation dose to as low as reasonably achievable. COMPARISON: None available. CLINICAL HISTORY: fall FINDINGS: FACIAL BONES: No acute facial fracture. No mandibular dislocation. No suspicious bone lesion. ORBITS: Globes are intact. No acute traumatic injury. No inflammatory change. SINUSES AND MASTOIDS: No acute abnormality. SOFT TISSUES: Small left periorbital hematoma. IMPRESSION: 1. Small left periorbital hematoma. Electronically signed by: Franky Stanford MD 11/24/2024 08:46 PM EST RP Workstation: [id number]   CT Head Wo Contrast Result Date: 11/24/2024 EXAM: CT HEAD  WITHOUT CONTRAST 11/24/2024 08:33:35 PM TECHNIQUE: CT of the head was performed without the administration of intravenous contrast. Automated exposure control, iterative reconstruction, and/or weight based adjustment of the mA/kV was utilized to reduce the radiation dose to as low as reasonably achievable. COMPARISON: None available. CLINICAL HISTORY: fall FINDINGS: BRAIN AND VENTRICLES: No acute hemorrhage. No evidence of acute infarct. No hydrocephalus. No extra-axial collection. No mass effect or midline shift. Atherosclerotic calcifications within the cavernous internal carotid arteries. ORBITS: Bilateral lens replacement. Left  periorbital subcutaneous soft tissue edema and hematoma. SINUSES: No acute abnormality. SOFT TISSUES AND SKULL: Left periorbital subcutaneous soft tissue edema and hematoma. No skull fracture. IMPRESSION: 1. Left periorbital subcutaneous soft tissue edema and hematoma, likely related to the fall. 2. No acute intracranial abnormality. Electronically signed by: Franky Stanford MD 11/24/2024 08:45 PM EST RP Workstation: [id number]     Procedures   Medications Ordered in the ED - No data to display                                  Medical Decision Making This patient presents to the ED for concern of fall, this involves an extensive number of treatment options, and is a complaint that carries with it a high risk of complications and morbidity.  The differential diagnosis includes fracture, dislocation, concussion, contusion, intracranial injury/hemorrhage   Co morbidities that complicate the patient evaluation  Diabetes, hypertension  Imaging Studies ordered:  I ordered imaging studies including CT head, CT maxillofacial, XR L and R knees, XR L and R tib/fib  I independently visualized and interpreted imaging which showed  - CT head: 1. Left periorbital subcutaneous soft tissue edema and hematoma, likely related to the fall. 2. No acute intracranial abnormality. - CT maxillofacial: 1. Small left periorbital hematoma. - XR R knee: 1. No acute bony abnormality - XR L knee: 1. No acute bony abnormality. - XR R tib/fib: 1. No acute bony abnormality. - XR L tib/fib: 1. No acute fracture or dislocation. I agree with the radiologist interpretation   Cardiac Monitoring: / EKG:  The patient was maintained on a cardiac monitor.  I personally viewed and interpreted the cardiac monitored which showed an underlying rhythm of: NSR   Problem List / ED Course / Critical interventions / Medication management I have reviewed the patients home medicines and have made adjustments as needed   Test /  Admission - Considered:  Physical exam notable as above, patient with contusion/hematoma formation above left eye, EOMs are intact, patient denies eye pain or vision changes since fall.  No concern for ocular injury based on reassuring findings as above.  No loss of consciousness after the fall, no use of blood thinners.  Patient also complains of pain to shins bilaterally, there is minimal bruising noted to the right shin, history of bilateral knee replacements as well. CT and x-ray imaging obtained and is largely unremarkable as above, notable for left-sided periorbital hematoma formation as noted on exam, no associated fractures or intracranial injury/hemorrhage.  I recommend that Kristina Perkins continue to ice her eye to help alleviate the swelling/bruising, recommend continued use of Tylenol  as needed for pain/soreness.  Patient voiced understanding and is in agreement this plan, return precautions discussed, Kristina Perkins is appropriate for discharge at this time.  Staffed with Dr. Patsey  Amount and/or Complexity of Data Reviewed Radiology: ordered.        Final diagnoses:  Fall, initial encounter  ED Discharge Orders     None          Woodfin Kiss N, PA-C 11/24/24 2201    Patsey Lot, MD 11/27/24 1502  "

## 2024-12-04 ENCOUNTER — Ambulatory Visit (HOSPITAL_COMMUNITY)
Admission: RE | Admit: 2024-12-04 | Discharge: 2024-12-04 | Disposition: A | Discharge status: Home / Self Care | Source: Ambulatory Visit | Attending: Surgery | Admitting: Surgery

## 2024-12-04 ENCOUNTER — Other Ambulatory Visit (HOSPITAL_COMMUNITY): Payer: Self-pay | Admitting: Nurse Practitioner

## 2024-12-04 DIAGNOSIS — M7989 Other specified soft tissue disorders: Secondary | ICD-10-CM | POA: Diagnosis not present

## 2024-12-04 DIAGNOSIS — M79605 Pain in left leg: Secondary | ICD-10-CM | POA: Insufficient documentation

## 2024-12-14 ENCOUNTER — Other Ambulatory Visit: Payer: Self-pay | Admitting: Family Medicine

## 2024-12-14 DIAGNOSIS — E2839 Other primary ovarian failure: Secondary | ICD-10-CM

## 2025-01-03 ENCOUNTER — Other Ambulatory Visit
